# Patient Record
Sex: Male | Born: 1960 | Hispanic: Yes | Smoking: Never smoker
Health system: Southern US, Community
[De-identification: ages and names within clinical notes are randomized; demographics above are authoritative.]

## PROBLEM LIST (undated history)

## (undated) DIAGNOSIS — M109 Gout, unspecified: Secondary | ICD-10-CM

## (undated) DIAGNOSIS — S0291XA Unspecified fracture of skull, initial encounter for closed fracture: Secondary | ICD-10-CM

## (undated) HISTORY — PX: APPENDECTOMY: SHX54

---

## 1998-06-27 ENCOUNTER — Emergency Department (HOSPITAL_COMMUNITY): Admission: EM | Admit: 1998-06-27 | Discharge: 1998-06-27 | Payer: Self-pay | Admitting: Emergency Medicine

## 1999-09-13 ENCOUNTER — Emergency Department (HOSPITAL_COMMUNITY): Admission: EM | Admit: 1999-09-13 | Discharge: 1999-09-13 | Payer: Self-pay | Admitting: Emergency Medicine

## 1999-09-13 ENCOUNTER — Encounter: Payer: Self-pay | Admitting: Emergency Medicine

## 1999-10-09 ENCOUNTER — Encounter: Payer: Self-pay | Admitting: Neurology

## 1999-10-09 ENCOUNTER — Encounter: Admission: RE | Admit: 1999-10-09 | Discharge: 1999-10-09 | Payer: Self-pay | Admitting: Neurology

## 1999-11-02 ENCOUNTER — Emergency Department (HOSPITAL_COMMUNITY): Admission: EM | Admit: 1999-11-02 | Discharge: 1999-11-02 | Payer: Self-pay | Admitting: Emergency Medicine

## 2000-01-02 ENCOUNTER — Emergency Department (HOSPITAL_COMMUNITY): Admission: EM | Admit: 2000-01-02 | Discharge: 2000-01-02 | Payer: Self-pay

## 2000-09-26 ENCOUNTER — Encounter: Payer: Self-pay | Admitting: Internal Medicine

## 2000-09-26 ENCOUNTER — Emergency Department (HOSPITAL_COMMUNITY): Admission: EM | Admit: 2000-09-26 | Discharge: 2000-09-26 | Payer: Self-pay | Admitting: Internal Medicine

## 2004-11-15 ENCOUNTER — Ambulatory Visit: Payer: Self-pay | Admitting: Internal Medicine

## 2014-10-06 ENCOUNTER — Encounter: Payer: Self-pay | Admitting: Medical

## 2014-10-06 NOTE — Progress Notes (Signed)
This encounter was created in error - please disregard.

## 2015-01-27 ENCOUNTER — Ambulatory Visit: Payer: Self-pay | Admitting: Internal Medicine

## 2015-01-27 ENCOUNTER — Ambulatory Visit: Payer: Self-pay | Admitting: Medical

## 2017-12-24 ENCOUNTER — Emergency Department (HOSPITAL_BASED_OUTPATIENT_CLINIC_OR_DEPARTMENT_OTHER)
Admission: EM | Admit: 2017-12-24 | Discharge: 2017-12-24 | Disposition: A | Discharge status: Home / Self Care | Payer: No Typology Code available for payment source | Attending: Emergency Medicine | Admitting: Emergency Medicine

## 2017-12-24 ENCOUNTER — Emergency Department (HOSPITAL_BASED_OUTPATIENT_CLINIC_OR_DEPARTMENT_OTHER): Payer: No Typology Code available for payment source

## 2017-12-24 ENCOUNTER — Encounter (HOSPITAL_BASED_OUTPATIENT_CLINIC_OR_DEPARTMENT_OTHER): Payer: Self-pay

## 2017-12-24 ENCOUNTER — Other Ambulatory Visit: Payer: Self-pay

## 2017-12-24 DIAGNOSIS — R51 Headache: Secondary | ICD-10-CM | POA: Diagnosis not present

## 2017-12-24 HISTORY — DX: Gout, unspecified: M10.9

## 2017-12-24 HISTORY — DX: Unspecified fracture of skull, initial encounter for closed fracture: S02.91XA

## 2017-12-24 MED ORDER — METHOCARBAMOL 500 MG PO TABS: 500.0000 mg | ORAL_TABLET | Freq: Three times a day (TID) | ORAL | 0 refills | Status: DC | PRN
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
## 2018-01-07 ENCOUNTER — Other Ambulatory Visit: Payer: Self-pay

## 2018-01-07 ENCOUNTER — Encounter (HOSPITAL_BASED_OUTPATIENT_CLINIC_OR_DEPARTMENT_OTHER): Payer: Self-pay

## 2018-01-07 ENCOUNTER — Emergency Department (HOSPITAL_COMMUNITY): Payer: No Typology Code available for payment source

## 2018-01-07 ENCOUNTER — Emergency Department (HOSPITAL_BASED_OUTPATIENT_CLINIC_OR_DEPARTMENT_OTHER)
Admission: EM | Admit: 2018-01-07 | Discharge: 2018-01-07 | Disposition: A | Discharge status: Home / Self Care | Payer: No Typology Code available for payment source | Attending: Emergency Medicine | Admitting: Emergency Medicine

## 2018-01-07 DIAGNOSIS — Z79899 Other long term (current) drug therapy: Secondary | ICD-10-CM | POA: Diagnosis not present

## 2018-01-07 DIAGNOSIS — R51 Headache: Secondary | ICD-10-CM | POA: Diagnosis present

## 2018-01-07 DIAGNOSIS — F0781 Postconcussional syndrome: Secondary | ICD-10-CM | POA: Insufficient documentation

## 2018-01-07 DIAGNOSIS — G44309 Post-traumatic headache, unspecified, not intractable: Secondary | ICD-10-CM | POA: Insufficient documentation

## 2018-01-07 MED ORDER — DIPHENHYDRAMINE HCL 50 MG/ML IJ SOLN
25.0000 mg | Freq: Once | INTRAMUSCULAR | Status: AC
  Administered 2018-01-07: 25 mg via INTRAVENOUS
  Filled 2018-01-07: qty 1

## 2018-01-07 MED ORDER — KETOROLAC TROMETHAMINE 30 MG/ML IJ SOLN
30.0000 mg | Freq: Once | INTRAMUSCULAR | Status: AC
  Administered 2018-01-07: 30 mg via INTRAVENOUS
  Filled 2018-01-07: qty 1

## 2018-01-07 MED ORDER — METOCLOPRAMIDE HCL 5 MG/ML IJ SOLN
10.0000 mg | Freq: Once | INTRAMUSCULAR | Status: AC
  Administered 2018-01-07: 10 mg via INTRAVENOUS
  Filled 2018-01-07: qty 2

## 2018-01-07 MED ORDER — AMITRIPTYLINE HCL 50 MG PO TABS: 50.0000 mg | ORAL_TABLET | Freq: Every day | ORAL | 0 refills | Status: DC

## 2018-01-07 MED ORDER — SODIUM CHLORIDE 0.9 % IV BOLUS
500.0000 mL | Freq: Once | INTRAVENOUS | Status: AC
  Administered 2018-01-07: 500 mL via INTRAVENOUS
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
## 2018-01-07 NOTE — ED Provider Notes (Signed)
  Physical Exam  BP 129/75   Pulse (!) 58   Temp 98 F (36.7 C) (Oral)   Resp 16   Ht 5\' 7"  (1.702 m)   Wt 111.6 kg (246 lb)   SpO2 96%   BMI 38.53 kg/m   Physical Exam  ED Course/Procedures     Procedures  MDM  Patient transferred from med center for MRI MRV.  States headache is doing some better but is sleepy due to the medicines.  MRI is negative.  Likely concussion/postconcussive syndrome.  Discharge home with amitriptyline as per neurology recommendation.       Benjiman CorePickering, Keldan Eplin, MD 01/09/18 [phone]

## 2018-01-07 NOTE — ED Notes (Addendum)
Pt called x2 in lobby to be roomed. No response x2.
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
## 2020-11-10 ENCOUNTER — Ambulatory Visit (INDEPENDENT_AMBULATORY_CARE_PROVIDER_SITE_OTHER): Payer: Self-pay

## 2020-11-10 ENCOUNTER — Other Ambulatory Visit: Payer: Self-pay

## 2020-11-10 ENCOUNTER — Ambulatory Visit (INDEPENDENT_AMBULATORY_CARE_PROVIDER_SITE_OTHER): Payer: Self-pay | Admitting: Sports Medicine

## 2020-11-10 DIAGNOSIS — M25511 Pain in right shoulder: Secondary | ICD-10-CM | POA: Insufficient documentation

## 2020-11-10 DIAGNOSIS — M170 Bilateral primary osteoarthritis of knee: Secondary | ICD-10-CM

## 2020-11-10 DIAGNOSIS — G8929 Other chronic pain: Secondary | ICD-10-CM

## 2020-11-10 DIAGNOSIS — M109 Gout, unspecified: Secondary | ICD-10-CM | POA: Insufficient documentation

## 2020-11-10 DIAGNOSIS — E669 Obesity, unspecified: Secondary | ICD-10-CM | POA: Insufficient documentation

## 2020-11-10 DIAGNOSIS — M2550 Pain in unspecified joint: Secondary | ICD-10-CM

## 2020-11-10 DIAGNOSIS — M1A0710 Idiopathic chronic gout, right ankle and foot, without tophus (tophi): Secondary | ICD-10-CM

## 2020-11-10 MED ORDER — PREDNISONE 10 MG (48) PO TBPK: ORAL_TABLET | Freq: Every day | ORAL | 0 refills | Status: DC

## 2020-11-10 NOTE — Assessment & Plan Note (Addendum)
Chronic idiopathic gout, currently on 300 mg of allopurinol daily, it sounds like his uric acid levels were still high, he is due for recheck so we will go ahead and check his uric acid levels and increase to 300 twice daily if above 5. We could also add probenecid if we maxed out his allopurinol.  Update 11/13/2020: Uric acid levels are above 9, increasing allopurinol to 300 mg twice daily.  Recheck uric acid levels in a month.

## 2020-11-10 NOTE — Assessment & Plan Note (Signed)
Widespread aches and pains with morning stiffness, we do need to do a full rheumatoid panel. Also starting a prednisone taper.

## 2020-11-10 NOTE — Assessment & Plan Note (Signed)
Bilateral x-rays, prednisone as above, we will address the individual joints at a future visit.

## 2020-11-10 NOTE — Assessment & Plan Note (Signed)
Needs to work aggressively on weight loss both with nutrition and his PCP.

## 2020-11-10 NOTE — Progress Notes (Addendum)
    Procedures performed today:    None.  Independent interpretation of notes and tests performed by another provider:   None.  Brief History, Exam, Impression, and Recommendations:    Polyarthralgia Widespread aches and pains with morning stiffness, we do need to do a full rheumatoid panel. Also starting a prednisone taper.  Gout Chronic idiopathic gout, currently on 300 mg of allopurinol daily, it sounds like his uric acid levels were still high, he is due for recheck so we will go ahead and check his uric acid levels and increase to 300 twice daily if above 5. We could also add probenecid if we maxed out his allopurinol.  Update 11/13/2020: Uric acid levels are above 9, increasing allopurinol to 300 mg twice daily.  Recheck uric acid levels in a month.  Right shoulder pain Impingement symptoms and signs on the right with a positive Hawkins sign, we will do a full rheumatoid work-up and some prednisone before considering treating his individual pains. I would like some x-rays of his shoulder  Primary osteoarthritis of both knees Bilateral x-rays, prednisone as above, we will address the individual joints at a future visit.  Morbid obesity (HCC) Needs to work aggressively on weight loss both with nutrition and his PCP.    ___________________________________________ Ihor Austin. Benjamin Stain, M.D., ABFM., CAQSM. Primary Care and Sports Medicine [facility]  Adjunct Instructor of Family Medicine  [employer]

## 2020-11-10 NOTE — Assessment & Plan Note (Signed)
Impingement symptoms and signs on the right with a positive Hawkins sign, we will do a full rheumatoid work-up and some prednisone before considering treating his individual pains. I would like some x-rays of his shoulder

## 2020-11-11 ENCOUNTER — Telehealth: Payer: Self-pay | Admitting: Physician Assistant

## 2020-11-11 DIAGNOSIS — M2550 Pain in unspecified joint: Secondary | ICD-10-CM

## 2020-11-11 LAB — CBC WITH DIFFERENTIAL/PLATELET: Absolute Monocytes: 1003 cells/uL — ABNORMAL HIGH (ref 200–950)

## 2020-11-11 LAB — COMPREHENSIVE METABOLIC PANEL: AST: 48 U/L — ABNORMAL HIGH (ref 10–35)

## 2020-11-11 MED ORDER — CELECOXIB 100 MG PO CAPS: 100.0000 mg | ORAL_CAPSULE | Freq: Two times a day (BID) | ORAL | 0 refills | Status: DC

## 2020-11-13 ENCOUNTER — Other Ambulatory Visit: Payer: Self-pay | Admitting: Sports Medicine

## 2020-11-13 LAB — CBC WITH DIFFERENTIAL/PLATELET: RDW: 13.4 % (ref 11.0–15.0)

## 2020-11-13 MED ORDER — DEXAMETHASONE 4 MG PO TABS: 4.0000 mg | ORAL_TABLET | Freq: Three times a day (TID) | ORAL | 0 refills | Status: DC

## 2020-11-13 MED ORDER — ALLOPURINOL 300 MG PO TABS: 300.0000 mg | ORAL_TABLET | Freq: Two times a day (BID) | ORAL | 3 refills | Status: AC

## 2020-11-13 NOTE — Addendum Note (Signed)
Addended by: Monica Becton on: 11/13/2020 04:58 PM   Modules accepted: Orders
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
## 2020-11-13 NOTE — Telephone Encounter (Signed)
Pt advised about new medication.

## 2020-11-14 LAB — COMPREHENSIVE METABOLIC PANEL: Albumin: 4.3 g/dL (ref 3.6–5.1)

## 2020-11-17 LAB — LUPUS(12) PANEL
Anti Nuclear Antibody (ANA): NEGATIVE
C3 Complement: 150 mg/dL (ref 82–185)
C4 Complement: 15 mg/dL (ref 15–53)
ENA SM Ab Ser-aCnc: <1 AI
Rheumatoid fact SerPl-aCnc: <14 [IU]/mL (ref <14)
Ribosomal P Protein Ab: <1 AI
SM/RNP: <1 AI
SSA (Ro) (ENA) Antibody, IgG: <1 AI
SSB (La) (ENA) Antibody, IgG: <1 AI
Scleroderma (Scl-70) (ENA) Antibody, IgG: <1 AI
Thyroperoxidase Ab SerPl-aCnc: <1 IU/mL (ref <9)
ds DNA Ab: <1 [IU]/mL

## 2020-11-17 LAB — RHEUMATOID FACTOR (IGA, IGG, IGM)
Rheumatoid Factor (IgA): <5 U (ref <=6)
Rheumatoid Factor (IgG): <5 U (ref <=6)
Rheumatoid Factor (IgM): <5 U (ref <=6)

## 2020-11-17 LAB — CBC WITH DIFFERENTIAL/PLATELET
Basophils Absolute: 70 {cells}/uL (ref 0–200)
Basophils Relative: 0.8 %
Eosinophils Absolute: 308 cells/uL (ref 15–500)
Eosinophils Relative: 3.5 %
HCT: 48.5 % (ref 38.5–50.0)
Hemoglobin: 16.2 g/dL (ref 13.2–17.1)
Lymphs Abs: 3027 cells/uL (ref 850–3900)
MCH: 31 pg (ref 27.0–33.0)
MCHC: 33.4 g/dL (ref 32.0–36.0)
MCV: 92.9 fL (ref 80.0–100.0)
MPV: 11.5 fL (ref 7.5–12.5)
Monocytes Relative: 11.4 %
Neutro Abs: 4391 {cells}/uL (ref 1500–7800)
Neutrophils Relative %: 49.9 %
Platelets: 224 Thousand/uL (ref 140–400)
RBC: 5.22 10*6/uL (ref 4.20–5.80)
Total Lymphocyte: 34.4 %
WBC: 8.8 Thousand/uL (ref 3.8–10.8)

## 2020-11-17 LAB — COMPREHENSIVE METABOLIC PANEL WITH GFR
AG Ratio: 1.5 (calc) (ref 1.0–2.5)
Alkaline phosphatase (APISO): 56 U/L (ref 35–144)
BUN/Creatinine Ratio: 29 (calc) — ABNORMAL HIGH (ref 6–22)
BUN: 28 mg/dL — ABNORMAL HIGH (ref 7–25)
Calcium: 9.6 mg/dL (ref 8.6–10.3)
Chloride: 105 mmol/L (ref 98–110)
Globulin: 2.8 g/dL (ref 1.9–3.7)
Potassium: 4.5 mmol/L (ref 3.5–5.3)
Sodium: 139 mmol/L (ref 135–146)

## 2020-11-17 LAB — HLA-B27 ANTIGEN: HLA-B27 Antigen: POSITIVE — AB

## 2020-11-17 LAB — COMPREHENSIVE METABOLIC PANEL
ALT: 88 U/L — ABNORMAL HIGH (ref 9–46)
CO2: 25 mmol/L (ref 20–32)
Creat: 0.98 mg/dL (ref 0.70–1.33)
Glucose, Bld: 104 mg/dL — ABNORMAL HIGH (ref 65–99)
Total Bilirubin: 0.3 mg/dL (ref 0.2–1.2)
Total Protein: 7.1 g/dL (ref 6.1–8.1)

## 2020-11-17 LAB — CK: Total CK: 77 U/L (ref 44–196)

## 2020-11-17 LAB — URIC ACID: Uric Acid, Serum: 9.1 mg/dL — ABNORMAL HIGH (ref 4.0–8.0)

## 2020-11-17 LAB — CYCLIC CITRUL PEPTIDE ANTIBODY, IGG: Cyclic Citrullin Peptide Ab: <16 U

## 2020-11-17 LAB — SEDIMENTATION RATE: Sed Rate: 2 mm/h (ref 0–20)

## 2020-11-24 ENCOUNTER — Other Ambulatory Visit: Payer: Self-pay | Admitting: Physician Assistant

## 2020-11-24 DIAGNOSIS — M2550 Pain in unspecified joint: Secondary | ICD-10-CM

## 2021-02-05 ENCOUNTER — Other Ambulatory Visit: Payer: Self-pay

## 2021-02-05 ENCOUNTER — Emergency Department (HOSPITAL_BASED_OUTPATIENT_CLINIC_OR_DEPARTMENT_OTHER)
Admission: EM | Admit: 2021-02-05 | Discharge: 2021-02-06 | Disposition: A | Discharge status: Home / Self Care | Payer: Self-pay | Attending: Emergency Medicine | Admitting: Emergency Medicine

## 2021-02-05 ENCOUNTER — Encounter (HOSPITAL_BASED_OUTPATIENT_CLINIC_OR_DEPARTMENT_OTHER): Payer: Self-pay

## 2021-02-05 DIAGNOSIS — E860 Dehydration: Secondary | ICD-10-CM

## 2021-02-05 DIAGNOSIS — R112 Nausea with vomiting, unspecified: Secondary | ICD-10-CM

## 2021-02-05 DIAGNOSIS — R197 Diarrhea, unspecified: Secondary | ICD-10-CM | POA: Insufficient documentation

## 2021-02-05 DIAGNOSIS — K529 Noninfective gastroenteritis and colitis, unspecified: Secondary | ICD-10-CM

## 2021-02-05 DIAGNOSIS — Z8739 Personal history of other diseases of the musculoskeletal system and connective tissue: Secondary | ICD-10-CM

## 2021-02-05 LAB — URINALYSIS, ROUTINE W REFLEX MICROSCOPIC
Bilirubin Urine: NEGATIVE
Glucose, UA: NEGATIVE mg/dL
Hgb urine dipstick: NEGATIVE
Ketones, ur: NEGATIVE mg/dL
Leukocytes,Ua: NEGATIVE
Nitrite: NEGATIVE
Protein, ur: NEGATIVE mg/dL
Specific Gravity, Urine: <1.005 — ABNORMAL LOW (ref 1.005–1.030)
pH: 6 (ref 5.0–8.0)

## 2021-02-05 LAB — COMPREHENSIVE METABOLIC PANEL
ALT: 44 U/L (ref 0–44)
AST: 26 U/L (ref 15–41)
Albumin: 3.9 g/dL (ref 3.5–5.0)
Alkaline Phosphatase: 51 U/L (ref 38–126)
Anion gap: 7 (ref 5–15)
BUN: 22 mg/dL — ABNORMAL HIGH (ref 6–20)
CO2: 20 mmol/L — ABNORMAL LOW (ref 22–32)
Calcium: 8.7 mg/dL — ABNORMAL LOW (ref 8.9–10.3)
Chloride: 108 mmol/L (ref 98–111)
Creatinine, Ser: 1.16 mg/dL (ref 0.61–1.24)
GFR, Estimated: >60 mL/min (ref >60)
Glucose, Bld: 115 mg/dL — ABNORMAL HIGH (ref 70–99)
Potassium: 3.7 mmol/L (ref 3.5–5.1)
Sodium: 135 mmol/L (ref 135–145)
Total Bilirubin: 0.6 mg/dL (ref 0.3–1.2)
Total Protein: 7.3 g/dL (ref 6.5–8.1)

## 2021-02-05 LAB — CBC
HCT: 47.2 % (ref 39.0–52.0)
Hemoglobin: 16 g/dL (ref 13.0–17.0)
MCH: 31.6 pg (ref 26.0–34.0)
MCHC: 33.9 g/dL (ref 30.0–36.0)
MCV: 93.1 fL (ref 80.0–100.0)
Platelets: 228 10*3/uL (ref 150–400)
RBC: 5.07 MIL/uL (ref 4.22–5.81)
RDW: 13.6 % (ref 11.5–15.5)
WBC: 11.1 10*3/uL — ABNORMAL HIGH (ref 4.0–10.5)
nRBC: 0 % (ref 0.0–0.2)

## 2021-02-05 LAB — LIPASE, BLOOD: Lipase: 29 U/L (ref 11–51)

## 2021-02-05 MED ORDER — ONDANSETRON 8 MG PO TBDP: 8.0000 mg | ORAL_TABLET | Freq: Three times a day (TID) | ORAL | 0 refills | Status: AC | PRN

## 2021-02-05 MED ORDER — ONDANSETRON HCL 4 MG/2ML IJ SOLN
4.0000 mg | Freq: Once | INTRAMUSCULAR | Status: AC
  Administered 2021-02-05: 4 mg via INTRAVENOUS
  Filled 2021-02-05: qty 2

## 2021-02-05 MED ORDER — SODIUM CHLORIDE 0.9 % IV BOLUS
1000.0000 mL | Freq: Once | INTRAVENOUS | Status: AC
  Administered 2021-02-05: 1000 mL via INTRAVENOUS

## 2021-02-05 MED ORDER — DEXAMETHASONE SODIUM PHOSPHATE 10 MG/ML IJ SOLN
10.0000 mg | Freq: Once | INTRAMUSCULAR | Status: AC
  Administered 2021-02-05: 10 mg via INTRAMUSCULAR
  Filled 2021-02-05: qty 1

## 2021-02-05 MED ORDER — TRAMADOL HCL 50 MG PO TABS
50.0000 mg | ORAL_TABLET | Freq: Once | ORAL | Status: AC
Start: 2021-02-05 — End: 2021-02-05
  Administered 2021-02-05: 50 mg via ORAL
  Filled 2021-02-05: qty 1

## 2021-02-05 NOTE — ED Notes (Signed)
Pt informed of waiting on stool results, importance of hand hygeine and needing to use bedside cammode while here in ED

## 2021-02-05 NOTE — ED Notes (Signed)
Pt used bedside commode.  Stool sample sent to lab
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
## 2021-02-05 NOTE — Discharge Instructions (Addendum)
It was our pleasure to provide your ER care today - we hope that you feel better.  Drink plenty of fluids/stay well hydrated. Take zofran as need for nausea.   Take your gout medication as need.   We sent some stool studies which remain pending - you may check MyChart and/or call for results in AM.   Follow up with primary care doctor in the next 2-3 days if symptoms fail to improve/resolve.  Return to ER if worse, new symptoms, fevers, new, worsening or severe pain, severe abdominal pain, persistent vomiting, or other concern.   You were given pain medication in the ER - no driving for the next 6 hours.

## 2021-02-05 NOTE — ED Notes (Signed)
Pt assisted onto cammode

## 2021-02-05 NOTE — ED Notes (Signed)
Pt updated on plan of care

## 2021-02-05 NOTE — ED Provider Notes (Signed)
[facility] EMERGENCY DEPARTMENT Provider Note   CSN: [id number] Arrival date & time: 02/05/21  1718     History Chief Complaint  Patient presents with   Emesis    Jesse Davidson is a 60 y.o. male.  Patient with c/o nausea/vomiting and diarrhea in the past 5 days. Symptoms acute onset day after eating at restaurant. States others that ate with him were similarly ill, but they improved after 2-3 days. Symptoms acute onset, episodic, persistent. Emesis is clear or color recently ingested food and liquids - not bloody or bilious. Diarrhea watery, not bloody. No recent change in meds, new meds, or antibiotic use. No travel. No fever or chills. No constant and/or focal abd pain. No dysuria or gu c/o. No chest pain or sob.   The history is provided by the patient.  Emesis Associated symptoms: diarrhea   Associated symptoms: no cough, no fever, no headaches and no sore throat       Past Medical History:  Diagnosis Date   Gout    Skull fracture [facility])     Patient Active Problem List   Diagnosis Date Noted   Polyarthralgia 11/10/2020   Gout 11/10/2020   Right shoulder pain 11/10/2020   Primary osteoarthritis of both knees 11/10/2020   Morbid obesity (HCC) 11/10/2020    Past Surgical History:  Procedure Laterality Date   APPENDECTOMY         No family history on file.  Social History   Tobacco Use   Smoking status: Never   Smokeless tobacco: Never  Substance Use Topics   Alcohol use: Not Currently   Drug use: Never    Home Medications Prior to Admission medications   Medication Sig Start Date End Date Taking? Authorizing Provider  allopurinol (ZYLOPRIM) 300 MG tablet Take 1 tablet (300 mg total) by mouth 2 (two) times daily. 11/13/20   Monica Becton, MD  celecoxib (CELEBREX) 100 MG capsule Take 1 capsule (100 mg total) by mouth 2 (two) times daily. 11/11/20   Abonza, Kandis Cocking, PA-C  colchicine 0.6 MG tablet colchicine 0.6 mg tablet  TAKE ONE  TABLET BY MOUTH TWICE A DAY    [provider]  dexamethasone (DECADRON) 4 MG tablet Take 1 tablet (4 mg total) by mouth 3 (three) times daily. 11/13/20   Monica Becton, MD    Allergies    Codeine, Hydrocodone, and Prednisone  Review of Systems   Review of Systems  Constitutional:  Negative for fever.  HENT:  Negative for sore throat.   Eyes:  Negative for redness.  Respiratory:  Negative for cough and shortness of breath.   Cardiovascular:  Negative for chest pain.  Gastrointestinal:  Positive for diarrhea and vomiting.  Genitourinary:  Negative for dysuria and flank pain.  Musculoskeletal:  Negative for back pain and neck pain.  Skin:  Negative for rash.  Neurological:  Negative for headaches.  Hematological:  Does not bruise/bleed easily.  Psychiatric/Behavioral:  Negative for confusion.    Physical Exam Updated Vital Signs BP (!) 149/89 (BP Location: Right Arm)   Pulse (!) 104   Temp 99.3 F (37.4 C) (Oral)   Resp 18   Ht 1.702 m (5\' 7" )   Wt 117.9 kg   SpO2 95%   BMI 40.72 kg/m   Physical Exam Vitals and nursing note reviewed.  Constitutional:      Appearance: Normal appearance. He is well-developed.  HENT:     Head: Atraumatic.     Nose: Nose  normal.     Mouth/Throat:     Mouth: Mucous membranes are moist.     Pharynx: Oropharynx is clear.  Eyes:     General: No scleral icterus.    Conjunctiva/sclera: Conjunctivae normal.  Neck:     Trachea: No tracheal deviation.  Cardiovascular:     Rate and Rhythm: Normal rate and regular rhythm.     Pulses: Normal pulses.     Heart sounds: Normal heart sounds. No murmur heard.   No friction rub. No gallop.  Pulmonary:     Effort: Pulmonary effort is normal. No accessory muscle usage or respiratory distress.     Breath sounds: Normal breath sounds.  Abdominal:     General: Bowel sounds are normal. There is no distension.     Palpations: Abdomen is soft. There is no mass.     Tenderness: There is  no abdominal tenderness. There is no guarding or rebound.     Hernia: No hernia is present.  Genitourinary:    Comments: No cva tenderness. Musculoskeletal:        General: No swelling.     Cervical back: Normal range of motion and neck supple. No rigidity.  Skin:    General: Skin is warm and dry.     Findings: No rash.  Neurological:     Mental Status: He is alert.     Comments: Alert, speech clear.   Psychiatric:        Mood and Affect: Mood normal.    ED Results / Procedures / Treatments   Labs (all labs ordered are listed, but only abnormal results are displayed) Results for orders placed or performed during the hospital encounter of 02/05/21  Lipase, blood  Result Value Ref Range   Lipase 29 11 - 51 U/L  Comprehensive metabolic panel  Result Value Ref Range   Sodium 135 135 - 145 mmol/L   Potassium 3.7 3.5 - 5.1 mmol/L   Chloride 108 98 - 111 mmol/L   CO2 20 (L) 22 - 32 mmol/L   Glucose, Bld 115 (H) 70 - 99 mg/dL   BUN 22 (H) 6 - 20 mg/dL   Creatinine, Ser 6.31 0.61 - 1.24 mg/dL   Calcium 8.7 (L) 8.9 - 10.3 mg/dL   Total Protein 7.3 6.5 - 8.1 g/dL   Albumin 3.9 3.5 - 5.0 g/dL   AST 26 15 - 41 U/L   ALT 44 0 - 44 U/L   Alkaline Phosphatase 51 38 - 126 U/L   Total Bilirubin 0.6 0.3 - 1.2 mg/dL   GFR, Estimated >49 >70 mL/min   Anion gap 7 5 - 15  CBC  Result Value Ref Range   WBC 11.1 (H) 4.0 - 10.5 K/uL   RBC 5.07 4.22 - 5.81 MIL/uL   Hemoglobin 16.0 13.0 - 17.0 g/dL   HCT 26.3 78.5 - 88.5 %   MCV 93.1 80.0 - 100.0 fL   MCH 31.6 26.0 - 34.0 pg   MCHC 33.9 30.0 - 36.0 g/dL   RDW 02.7 74.1 - 28.7 %   Platelets 228 150 - 400 K/uL   nRBC 0.0 0.0 - 0.2 %      EKG None  Radiology No results found.  Procedures Procedures   Medications Ordered in ED Medications  sodium chloride 0.9 % bolus 1,000 mL (has no administration in time range)    ED Course  I have reviewed the triage vital signs and the nursing notes.  Pertinent labs & imaging results  that were available during my care of the patient were reviewed by me and considered in my medical decision making (see chart for details).    MDM Rules/Calculators/A&P                         Iv ns bolus. Zofran iv. Labs sent.   Reviewed nursing notes and prior charts for additional history.   Labs reviewed/interpreted by me - chem normal.   Additional liter ns iv.   Trial of po fluids.   Recheck abd soft nt.   No recurrent emesis.   Pt requests 'shot' for gout. Decadron im.   Pt also given ultram po.   Pt currently appears stable for d/c.   Return precautions provided.      Final Clinical Impression(s) / ED Diagnoses Final diagnoses:  None    Rx / DC Orders ED Discharge Orders     None        Cathren Laine, MD 02/05/21 2213

## 2021-02-06 LAB — C DIFFICILE QUICK SCREEN W PCR REFLEX
C Diff antigen: NEGATIVE
C Diff interpretation: NOT DETECTED
C Diff toxin: NEGATIVE

## 2021-02-06 LAB — GASTROINTESTINAL PANEL BY PCR, STOOL (REPLACES STOOL CULTURE)

## 2021-07-28 ENCOUNTER — Emergency Department (HOSPITAL_BASED_OUTPATIENT_CLINIC_OR_DEPARTMENT_OTHER): Payer: Self-pay

## 2021-07-28 ENCOUNTER — Encounter (HOSPITAL_BASED_OUTPATIENT_CLINIC_OR_DEPARTMENT_OTHER): Payer: Self-pay | Admitting: *Deleted

## 2021-07-28 ENCOUNTER — Other Ambulatory Visit: Payer: Self-pay

## 2021-07-28 ENCOUNTER — Inpatient Hospital Stay (HOSPITAL_BASED_OUTPATIENT_CLINIC_OR_DEPARTMENT_OTHER)
Admission: EM | Admit: 2021-07-28 | Discharge: 2021-07-31 | DRG: 177 | Disposition: A | Discharge status: Home / Self Care | Payer: Self-pay | Attending: Family Medicine | Admitting: Family Medicine

## 2021-07-28 ENCOUNTER — Emergency Department (HOSPITAL_BASED_OUTPATIENT_CLINIC_OR_DEPARTMENT_OTHER): Payer: Self-pay | Admitting: Radiology

## 2021-07-28 DIAGNOSIS — D72829 Elevated white blood cell count, unspecified: Secondary | ICD-10-CM | POA: Diagnosis present

## 2021-07-28 DIAGNOSIS — Z888 Allergy status to other drugs, medicaments and biological substances status: Secondary | ICD-10-CM

## 2021-07-28 DIAGNOSIS — U071 COVID-19: Principal | ICD-10-CM | POA: Diagnosis present

## 2021-07-28 DIAGNOSIS — Y92239 Unspecified place in hospital as the place of occurrence of the external cause: Secondary | ICD-10-CM | POA: Diagnosis present

## 2021-07-28 DIAGNOSIS — T380X5A Adverse effect of glucocorticoids and synthetic analogues, initial encounter: Secondary | ICD-10-CM | POA: Diagnosis present

## 2021-07-28 DIAGNOSIS — R0902 Hypoxemia: Secondary | ICD-10-CM

## 2021-07-28 DIAGNOSIS — Z885 Allergy status to narcotic agent status: Secondary | ICD-10-CM

## 2021-07-28 DIAGNOSIS — N179 Acute kidney failure, unspecified: Secondary | ICD-10-CM | POA: Diagnosis present

## 2021-07-28 DIAGNOSIS — J9601 Acute respiratory failure with hypoxia: Secondary | ICD-10-CM | POA: Diagnosis present

## 2021-07-28 DIAGNOSIS — E669 Obesity, unspecified: Secondary | ICD-10-CM | POA: Diagnosis present

## 2021-07-28 DIAGNOSIS — Z79899 Other long term (current) drug therapy: Secondary | ICD-10-CM

## 2021-07-28 DIAGNOSIS — Z6838 Body mass index (BMI) 38.0-38.9, adult: Secondary | ICD-10-CM

## 2021-07-28 DIAGNOSIS — Z20822 Contact with and (suspected) exposure to covid-19: Secondary | ICD-10-CM | POA: Diagnosis present

## 2021-07-28 LAB — COMPREHENSIVE METABOLIC PANEL
ALT: 53 U/L — ABNORMAL HIGH (ref 0–44)
AST: 33 U/L (ref 15–41)
Albumin: 3.9 g/dL (ref 3.5–5.0)
Alkaline Phosphatase: 44 U/L (ref 38–126)
Anion gap: 13 (ref 5–15)
BUN: 20 mg/dL (ref 6–20)
CO2: 25 mmol/L (ref 22–32)
Calcium: 8.7 mg/dL — ABNORMAL LOW (ref 8.9–10.3)
Chloride: 98 mmol/L (ref 98–111)
Creatinine, Ser: 1.5 mg/dL — ABNORMAL HIGH (ref 0.61–1.24)
GFR, Estimated: 53 mL/min — ABNORMAL LOW (ref >60)
Glucose, Bld: 106 mg/dL — ABNORMAL HIGH (ref 70–99)
Potassium: 4.3 mmol/L (ref 3.5–5.1)
Sodium: 136 mmol/L (ref 135–145)
Total Bilirubin: 0.7 mg/dL (ref 0.3–1.2)
Total Protein: 7.6 g/dL (ref 6.5–8.1)

## 2021-07-28 LAB — CBC WITH DIFFERENTIAL/PLATELET
Abs Immature Granulocytes: 0.19 10*3/uL — ABNORMAL HIGH (ref 0.00–0.07)
Basophils Absolute: 0.1 10*3/uL (ref 0.0–0.1)
Basophils Relative: 0 %
Eosinophils Absolute: 0 10*3/uL (ref 0.0–0.5)
Eosinophils Relative: 0 %
HCT: 54.3 % — ABNORMAL HIGH (ref 39.0–52.0)
Hemoglobin: 17.9 g/dL — ABNORMAL HIGH (ref 13.0–17.0)
Immature Granulocytes: 1 %
Lymphocytes Relative: 12 %
Lymphs Abs: 2.4 10*3/uL (ref 0.7–4.0)
MCH: 30.7 pg (ref 26.0–34.0)
MCHC: 33 g/dL (ref 30.0–36.0)
MCV: 93.1 fL (ref 80.0–100.0)
Monocytes Absolute: 1.5 10*3/uL — ABNORMAL HIGH (ref 0.1–1.0)
Monocytes Relative: 7 %
Neutro Abs: 15.9 10*3/uL — ABNORMAL HIGH (ref 1.7–7.7)
Neutrophils Relative %: 80 %
Platelets: 257 10*3/uL (ref 150–400)
RBC: 5.83 MIL/uL — ABNORMAL HIGH (ref 4.22–5.81)
RDW: 12.6 % (ref 11.5–15.5)
WBC: 20 10*3/uL — ABNORMAL HIGH (ref 4.0–10.5)
nRBC: 0 % (ref 0.0–0.2)

## 2021-07-28 LAB — I-STAT VENOUS BLOOD GAS, ED
Acid-Base Excess: 7 mmol/L — ABNORMAL HIGH (ref 0.0–2.0)
Bicarbonate: 33.3 mmol/L — ABNORMAL HIGH (ref 20.0–28.0)
Calcium, Ion: 1.09 mmol/L — ABNORMAL LOW (ref 1.15–1.40)
HCT: 55 % — ABNORMAL HIGH (ref 39.0–52.0)
Hemoglobin: 18.7 g/dL — ABNORMAL HIGH (ref 13.0–17.0)
O2 Saturation: 72 %
Potassium: 5.5 mmol/L — ABNORMAL HIGH (ref 3.5–5.1)
Sodium: 134 mmol/L — ABNORMAL LOW (ref 135–145)
TCO2: 35 mmol/L — ABNORMAL HIGH (ref 22–32)
pCO2, Ven: 51.8 mmHg (ref 44.0–60.0)
pH, Ven: 7.416 (ref 7.250–7.430)
pO2, Ven: 38 mmHg (ref 32.0–45.0)

## 2021-07-28 LAB — RESP PANEL BY RT-PCR (FLU A&B, COVID) ARPGX2
Influenza A by PCR: NEGATIVE
Influenza B by PCR: NEGATIVE
SARS Coronavirus 2 by RT PCR: POSITIVE — AB

## 2021-07-28 LAB — TROPONIN I (HIGH SENSITIVITY)
Troponin I (High Sensitivity): 23 ng/L — ABNORMAL HIGH (ref <18)
Troponin I (High Sensitivity): 17 ng/L (ref <18)

## 2021-07-28 MED ORDER — LACTATED RINGERS IV SOLN: INTRAVENOUS | Status: AC

## 2021-07-28 MED ORDER — METHYLPREDNISOLONE SODIUM SUCC 125 MG IJ SOLR
125.0000 mg | Freq: Once | INTRAMUSCULAR | Status: AC
  Administered 2021-07-28: 125 mg via INTRAVENOUS
  Filled 2021-07-28: qty 2

## 2021-07-28 MED ORDER — SODIUM CHLORIDE 0.9 % IV SOLN
100.0000 mg | Freq: Every day | INTRAVENOUS | Status: DC
  Administered 2021-07-29 – 2021-07-31 (×3): 100 mg via INTRAVENOUS
  Filled 2021-07-28 (×3): qty 20

## 2021-07-28 MED ORDER — IPRATROPIUM-ALBUTEROL 0.5-2.5 (3) MG/3ML IN SOLN: 3.0000 mL | Freq: Once | RESPIRATORY_TRACT | Status: DC

## 2021-07-28 MED ORDER — SODIUM CHLORIDE 0.9 % IV SOLN
100.0000 mg | Freq: Once | INTRAVENOUS | Status: AC
  Administered 2021-07-28: 100 mg via INTRAVENOUS

## 2021-07-28 MED ORDER — IPRATROPIUM-ALBUTEROL 0.5-2.5 (3) MG/3ML IN SOLN
3.0000 mL | Freq: Once | RESPIRATORY_TRACT | Status: AC
  Administered 2021-07-28: 3 mL via RESPIRATORY_TRACT
  Filled 2021-07-28: qty 3

## 2021-07-28 MED ORDER — MAGNESIUM SULFATE 2 GM/50ML IV SOLN
2.0000 g | Freq: Once | INTRAVENOUS | Status: AC
  Administered 2021-07-28: 2 g via INTRAVENOUS
  Filled 2021-07-28: qty 50

## 2021-07-28 MED ORDER — SODIUM CHLORIDE 0.9 % IV SOLN
100.0000 mg | Freq: Once | INTRAVENOUS | Status: AC
  Administered 2021-07-29: 100 mg via INTRAVENOUS
  Filled 2021-07-28: qty 20

## 2021-07-28 NOTE — ED Triage Notes (Addendum)
C/o cough. Onset 12/31. Has been seen for the same. No relief with albuterol, and prednisone. Using inhaler 2-3x per day, but doesn't seem to be getting medicine. 2 days left on prednisone taper. Seen at [facility]. Denies fever, chills, NVD. Decreased appetite. Denies allergy to prednisone. SPO2 is low.

## 2021-07-28 NOTE — ED Provider Notes (Signed)
[facility] EMERGENCY DEPT Provider Note   CSN: [id number] Arrival date & time: 07/28/21  1743     History  Chief Complaint  Patient presents with   Cough    Jesse Davidson is a 61 y.o. male.  Patient is a 61 year old male who has a history of gout but otherwise is relatively healthy presenting today with persistent cough and shortness of breath.  Patient reports that he started developing cough and URI symptoms on 07/21/2021.  Because symptoms were not improving he went and saw his doctor and was given albuterol and prednisone.  At the time he was tested for flu and COVID which was negative.  He denies any fever, productive cough, chills, nausea vomiting or diarrhea.  He has had decreased appetite and has felt short of breath.  He denies any chest pain.  He does not have a history of asthma and does not use inhalers regularly.  He does not use tobacco products.  He reports he usually gets sick about once a year and it usually is bad when he does.  He denies any history of CHF he has not noticed any swelling in his lower extremities and has no history of heart disease.  The history is provided by the patient and medical records.  Cough     Home Medications Prior to Admission medications   Medication Sig Start Date End Date Taking? Authorizing Provider  allopurinol (ZYLOPRIM) 300 MG tablet Take 1 tablet (300 mg total) by mouth 2 (two) times daily. 11/13/20   Monica Becton, MD  celecoxib (CELEBREX) 100 MG capsule Take 1 capsule (100 mg total) by mouth 2 (two) times daily. 11/11/20   Abonza, Kandis Cocking, PA-C  colchicine 0.6 MG tablet colchicine 0.6 mg tablet  TAKE ONE TABLET BY MOUTH TWICE A DAY    [provider]  dexamethasone (DECADRON) 4 MG tablet Take 1 tablet (4 mg total) by mouth 3 (three) times daily. 11/13/20   Monica Becton, MD  ondansetron (ZOFRAN ODT) 8 MG disintegrating tablet Take 1 tablet (8 mg total) by mouth every 8 (eight) hours as  needed for nausea or vomiting. 02/05/21   Cathren Laine, MD      Allergies    Codeine, Hydrocodone, and Prednisone    Review of Systems   Review of Systems  Respiratory:  Positive for cough.    Physical Exam Updated Vital Signs BP (!) 142/95    Pulse 95    Temp 99.9 F (37.7 C) (Oral)    Resp (!) 22    Ht 5\' 7"  (1.702 m)    Wt 111.1 kg    SpO2 90%    BMI 38.37 kg/m  Physical Exam Vitals and nursing note reviewed.  Constitutional:      General: He is not in acute distress.    Appearance: He is well-developed.  HENT:     Head: Normocephalic and atraumatic.  Eyes:     Conjunctiva/sclera: Conjunctivae normal.     Pupils: Pupils are equal, round, and reactive to light.  Cardiovascular:     Rate and Rhythm: Regular rhythm. Tachycardia present.     Pulses: Normal pulses.     Heart sounds: No murmur heard. Pulmonary:     Effort: Pulmonary effort is normal. Tachypnea present. No respiratory distress.     Breath sounds: Decreased air movement present. Wheezing present. No rales.     Comments: Persistent coughing throughout exam Abdominal:     General: There is no distension.  Palpations: Abdomen is soft.     Tenderness: There is no abdominal tenderness. There is no guarding or rebound.  Musculoskeletal:        General: No tenderness. Normal range of motion.     Cervical back: Normal range of motion and neck supple.     Right lower leg: No edema.     Left lower leg: No edema.  Skin:    General: Skin is warm and dry.     Findings: No erythema or rash.  Neurological:     Mental Status: He is alert and oriented to person, place, and time. Mental status is at baseline.  Psychiatric:        Mood and Affect: Mood normal.        Behavior: Behavior normal.    ED Results / Procedures / Treatments   Labs (all labs ordered are listed, but only abnormal results are displayed) Labs Reviewed  RESP PANEL BY RT-PCR (FLU A&B, COVID) ARPGX2 - Abnormal; Notable for the following  components:      Result Value   SARS Coronavirus 2 by RT PCR POSITIVE (*)    All other components within normal limits  CBC WITH DIFFERENTIAL/PLATELET - Abnormal; Notable for the following components:   WBC 20.0 (*)    RBC 5.83 (*)    Hemoglobin 17.9 (*)    HCT 54.3 (*)    Neutro Abs 15.9 (*)    Monocytes Absolute 1.5 (*)    Abs Immature Granulocytes 0.19 (*)    All other components within normal limits  COMPREHENSIVE METABOLIC PANEL - Abnormal; Notable for the following components:   Glucose, Bld 106 (*)    Creatinine, Ser 1.50 (*)    Calcium 8.7 (*)    ALT 53 (*)    GFR, Estimated 53 (*)    All other components within normal limits  I-STAT VENOUS BLOOD GAS, ED - Abnormal; Notable for the following components:   Bicarbonate 33.3 (*)    TCO2 35 (*)    Acid-Base Excess 7.0 (*)    Sodium 134 (*)    Potassium 5.5 (*)    Calcium, Ion 1.09 (*)    HCT 55.0 (*)    Hemoglobin 18.7 (*)    All other components within normal limits  TROPONIN I (HIGH SENSITIVITY) - Abnormal; Notable for the following components:   Troponin I (High Sensitivity) 23 (*)    All other components within normal limits  TROPONIN I (HIGH SENSITIVITY)    EKG EKG Interpretation  Date/Time:  Saturday July 28 2021 18:32:01 EST Ventricular Rate:  109 PR Interval:  135 QRS Duration: 86 QT Interval:  325 QTC Calculation: 438 R Axis:   -36 Text Interpretation: Sinus tachycardia Ventricular premature complex Left axis deviation Abnormal R-wave progression, late transition No previous tracing Confirmed by Gwyneth SproutPlunkett, Madalyn Legner ([id number]) on 07/28/2021 7:33:00 PM  Radiology DG Chest Port 1 View  Result Date: 07/28/2021 CLINICAL DATA:  Cough, shortness of breath EXAM: PORTABLE CHEST 1 VIEW COMPARISON:  None. FINDINGS: Heart and mediastinal contours are within normal limits. No focal opacities or effusions. No acute bony abnormality. IMPRESSION: No active disease. Electronically Signed   By: Charlett NoseKevin  Dover M.D.   On:  07/28/2021 19:20    Procedures Procedures    Medications Ordered in ED Medications  ipratropium-albuterol (DUONEB) 0.5-2.5 (3) MG/3ML nebulizer solution 3 mL (3 mLs Nebulization Not Given 07/28/21 1844)  lactated ringers infusion ( Intravenous New Bag/Given 07/28/21 1956)  ipratropium-albuterol (DUONEB) 0.5-2.5 (3) MG/3ML nebulizer solution  3 mL (3 mLs Nebulization Given 07/28/21 1844)  methylPREDNISolone sodium succinate (SOLU-MEDROL) 125 mg/2 mL injection 125 mg (125 mg Intravenous Given 07/28/21 1842)  magnesium sulfate IVPB 2 g 50 mL (0 g Intravenous Stopped 07/28/21 2016)    ED Course/ Medical Decision Making/ A&P                           Medical Decision Making Amount and/or Complexity of Data Reviewed External Data Reviewed: notes. Labs: ordered. Decision-making details documented in ED Course. Radiology: ordered and independent interpretation performed. Decision-making details documented in ED Course. ECG/medicine tests: ordered and independent interpretation performed. Decision-making details documented in ED Course.  Risk Decision regarding hospitalization.   Patient is a 61 year old male presenting today with worsening cough and shortness of breath.  Patient has now been sick for approximately 8 days.  He has been on 5 days of steroids and has been trying to use inhaler at home with minimal improvement.  Upon arrival here patient is hypoxic at 88% on room air and coughing constantly.  He has globally decreased breath sounds and mild expiratory wheezing.  He was given a DuoNeb, Solu-Medrol and magnesium.  On repeat exam he is still wheezing diffusely and sats dropped to 86% on room air and he was placed on 4 L of oxygen because at 2 L of oxygen he was still less than 90%.  Patient was on cardiac monitor continuously given his acute symptoms.  I independently interpreted patient's EKG which shows sinus tachycardia but no other acute findings.  He has no old to compare.  I independently  interpreted and evaluated patient's chest x-ray and it appears normal.  Radiology did not find any acute abnormality.  I independently ordered and evaluated and interpreted patient's labs he does have a leukocytosis of 20,000 today on his CBC which is most likely related to his recent steroid use and possibly from his infection.  Patient did test COVID-positive today and CMP otherwise within normal limits except for creatinine of 1.5 without old to compare.  Troponin mildly elevated today at 23 and will do a delta.  With patient's ongoing wheezing, oxygen requirement and COVID status we will get a VBG, give a continuous neb and given the above symptoms and increased risk of morbidity and mortality will admit for further care. Patient's length of symptoms he is not a candidate for COVID antivirals.  8:21 PM Reevaluation after treatment patient's coughing is improved.  He is still requiring 4 L of oxygen into still has some tachypnea.  O2 sats are between 90 and 95% on 4 L.  VBG without acute findings.        Final Clinical Impression(s) / ED Diagnoses Final diagnoses:  COVID  Acute respiratory failure with hypoxia [facility])    Rx / DC Orders ED Discharge Orders     None         Gwyneth Sprout, MD 07/28/21 2022

## 2021-07-28 NOTE — H&P (Signed)
History and Physical    Jesse Davidson YNW:[id number] DOB: 06/30/1961 DOA: 07/28/2021  PCP: Patient, No Pcp Per (Inactive)  Patient coming from: [facility]  I have personally briefly reviewed patient's old medical records in [facility]  Chief Complaint: Shortness of breath  HPI: Jesse Davidson is a 61 y.o. male with medical history significant for gout who presented to the ED for evaluation of persistent cough and shortness of breath.  Patient states on 07/21/2021 he developed URI symptoms with cough productive of white sputum.  He says he went to urgent care on 07/22/2021.  At the time he was tested for flu and COVID which were reportedly negative.  He was prescribed a Z-Pak, cough medication, and albuterol inhaler which she has been using regularly.  He has had persistent cough and exertional dyspnea.  He has been feeling generally fatigued.  He has had associated chills and diaphoresis, poor appetite, and decreased urine output which appears dark.  He has had chest and abdominal wall discomfort related to his frequent cough.  [facility] ED Course:  Initial vitals showed BP 129/86, pulse 104, RR 18, temp 99.5 F, SPO2 87% on room air.  Patient placed on 2 L O2 with initial improvement but desaturated to 86%, supplemental O2 increased to 4 L via [place] with improvement stable SPO2.  Labs show WBC 20.0, hemoglobin 17.9, platelets 257,000, sodium 136, potassium 4.3, bicarb 25, BUN 20, creatinine 1.50, serum glucose 106, troponin 23 > 17.  VBG showed pH 7.416, PCO2 51.8, PO2 38.  SARS-CoV-2 PCR is positive.  Influenza A/B negative.  Portable chest x-ray negative for focal consolidation, edema, effusion.  Patient was given IV Solu-Medrol 125 mg, IV magnesium 2 g, DuoNeb treatment, IV remdesivir, and IV fluids.  The hospitalist service was consulted to admit for further evaluation and management.  Review of Systems: All systems reviewed and are negative except as  documented in history of present illness above.   Past Medical History:  Diagnosis Date   Gout    Skull fracture (HCC)     Past Surgical History:  Procedure Laterality Date   APPENDECTOMY      Social History:  reports that he has never smoked. He has never used smokeless tobacco. He reports that he does not currently use alcohol. He reports that he does not use drugs.  Allergies  Allergen Reactions   Codeine Itching   Hydrocodone Itching   Prednisone Other (See Comments)    History reviewed. No pertinent family history.   Prior to Admission medications   Medication Sig Start Date End Date Taking? Authorizing Provider  allopurinol (ZYLOPRIM) 300 MG tablet Take 1 tablet (300 mg total) by mouth 2 (two) times daily. 11/13/20   Monica Bectonhekkekandam, Thomas J, MD  celecoxib (CELEBREX) 100 MG capsule Take 1 capsule (100 mg total) by mouth 2 (two) times daily. 11/11/20   Abonza, Kandis CockingMaritza, PA-C  colchicine 0.6 MG tablet colchicine 0.6 mg tablet  TAKE ONE TABLET BY MOUTH TWICE A DAY    [provider]  dexamethasone (DECADRON) 4 MG tablet Take 1 tablet (4 mg total) by mouth 3 (three) times daily. 11/13/20   Monica Bectonhekkekandam, Thomas J, MD  ondansetron (ZOFRAN ODT) 8 MG disintegrating tablet Take 1 tablet (8 mg total) by mouth every 8 (eight) hours as needed for nausea or vomiting. 02/05/21   Cathren LaineSteinl, Kevin, MD    Physical Exam: Vitals:   07/28/21 2045 07/28/21 2107 07/28/21 2230 07/28/21 2350  BP:  139/86 125/88 (!) 139/94  Pulse: 93 94 92 81  Resp: (!) 21 18 14 20   Temp:  99.9 F (37.7 C)  98 F (36.7 C)  TempSrc:  Oral  Oral  SpO2: (!) 88% 91% 93% 92%  Weight:      Height:       Constitutional: Resting in bed, NAD, calm, comfortable Eyes: PERRL, lids and conjunctivae normal ENMT: Mucous membranes are dry. Posterior pharynx clear of any exudate or lesions.Normal dentition.  Neck: normal, supple, no masses. Respiratory: clear to auscultation bilaterally, no wheezing, no crackles.   Coughs with deep inspiration.  Normal respiratory effort while on 4 L supplemental O2 via [place]. No accessory muscle use.  Cardiovascular: Regular rate and rhythm, no murmurs / rubs / gallops. No extremity edema. 2+ pedal pulses. Abdomen: no tenderness, no masses palpated. No hepatosplenomegaly. Bowel sounds positive.  Musculoskeletal: no clubbing / cyanosis. No joint deformity upper and lower extremities. Good ROM, no contractures. Normal muscle tone.  Skin: Diaphoretic, no rashes, lesions, ulcers. No induration Neurologic: CN 2-12 grossly intact. Sensation intact. Strength 5/5 in all 4.  Psychiatric: Normal judgment and insight. Alert and oriented x 3. Normal mood.   Labs on Admission: I have personally reviewed following labs and imaging studies  CBC: Recent Labs  Lab 07/28/21 1818 07/28/21 2000  WBC 20.0*  --   NEUTROABS 15.9*  --   HGB 17.9* 18.7*  HCT 54.3* 55.0*  MCV 93.1  --   PLT 257  --    Basic Metabolic Panel: Recent Labs  Lab 07/28/21 1818 07/28/21 2000  NA 136 134*  K 4.3 5.5*  CL 98  --   CO2 25  --   GLUCOSE 106*  --   BUN 20  --   CREATININE 1.50*  --   CALCIUM 8.7*  --    GFR: Estimated Creatinine Clearance: 63.1 mL/min (A) (by C-G formula based on SCr of 1.5 mg/dL (H)). Liver Function Tests: Recent Labs  Lab 07/28/21 1818  AST 33  ALT 53*  ALKPHOS 44  BILITOT 0.7  PROT 7.6  ALBUMIN 3.9   No results for input(s): LIPASE, AMYLASE in the last 168 hours. No results for input(s): AMMONIA in the last 168 hours. Coagulation Profile: No results for input(s): INR, PROTIME in the last 168 hours. Cardiac Enzymes: No results for input(s): CKTOTAL, CKMB, CKMBINDEX, TROPONINI in the last 168 hours. BNP (last 3 results) No results for input(s): PROBNP in the last 8760 hours. HbA1C: No results for input(s): HGBA1C in the last 72 hours. CBG: No results for input(s): GLUCAP in the last 168 hours. Lipid Profile: No results for input(s): CHOL, HDL, LDLCALC,  TRIG, CHOLHDL, LDLDIRECT in the last 72 hours. Thyroid Function Tests: No results for input(s): TSH, T4TOTAL, FREET4, T3FREE, THYROIDAB in the last 72 hours. Anemia Panel: No results for input(s): VITAMINB12, FOLATE, FERRITIN, TIBC, IRON, RETICCTPCT in the last 72 hours. Urine analysis:    Component Value Date/Time   COLORURINE YELLOW 02/05/2021 2337   APPEARANCEUR CLEAR 02/05/2021 2337   LABSPEC <1.005 (L) 02/05/2021 2337   PHURINE 6.0 02/05/2021 2337   GLUCOSEU NEGATIVE 02/05/2021 2337   HGBUR NEGATIVE 02/05/2021 2337   BILIRUBINUR NEGATIVE 02/05/2021 2337   KETONESUR NEGATIVE 02/05/2021 2337   PROTEINUR NEGATIVE 02/05/2021 2337   NITRITE NEGATIVE 02/05/2021 2337   LEUKOCYTESUR NEGATIVE 02/05/2021 2337    Radiological Exams on Admission: DG Chest Port 1 View  Result Date: 07/28/2021 CLINICAL DATA:  Cough, shortness of breath EXAM:  PORTABLE CHEST 1 VIEW COMPARISON:  None. FINDINGS: Heart and mediastinal contours are within normal limits. No focal opacities or effusions. No acute bony abnormality. IMPRESSION: No active disease. Electronically Signed   By: Charlett Nose M.D.   On: 07/28/2021 19:20    EKG: Personally reviewed. Sinus tachycardia, rate 109, PVCs present, late R wave transition.  No prior for comparison.  Assessment/Plan Principal Problem:   Acute respiratory failure with hypoxia (HCC) Active Problems:   COVID-19 virus infection   AKI (acute kidney injury) (HCC)   Leukocytosis   Jesse Davidson is a 61 y.o. male with medical history significant for gout who is admitted with acute respiratory failure with hypoxia due to COVID-19 viral infection.  Acute respiratory failure with hypoxia due to COVID-19 viral infection: SPO2 initially 88%, desaturated to 86% while on 2 L O2 via [place], and currently requiring 4 L O2 via [place] to keep SPO2 >92%.  CXR without evidence of pneumonia or significant infiltrate.  Suspect COVID-19 related bronchitis. -Continue IV remdesivir -Continue  IV Solu-Medrol given hypoxia -Continue Combivent, incentive spirometer, flutter valve, antitussives -Follow inflammatory markers  Acute kidney injury: Likely prerenal due to dehydration from poor oral intake.  Continue IV fluid hydration overnight and repeat labs in AM.  Leukocytosis: Likely secondary to steroid use with some element of hemoconcentration.  Repeat labs in AM.  DVT prophylaxis: Lovenox Code Status: Full code, confirmed with patient on admission Family Communication: Patient states he has no close family, he has notified his close acquaintances. Disposition Plan: From home and likely discharge to home pending clinical progress. Consults called: None Level of care: Med-Surg Admission status:  Status is: Inpatient  Remains inpatient appropriate because: Remains hypoxic requiring 4 L supplemental O2 via [place] to maintain SPO2 >88%.   Darreld Mclean MD [facility]  If 7PM-7AM, please contact night-coverage [email or web address]  07/29/2021, 12:43 AM

## 2021-07-28 NOTE — ED Notes (Signed)
Due to patient's SpO2 maintaining at 86% on 2LNC, literlfow increased to 4L/min. RN aware

## 2021-07-28 NOTE — Progress Notes (Signed)
  [facility] will assume care on arrival to accepting facility. Until arrival, care as per EDP. However, [facility] available 24/7 for questions and assistance.   Nursing staff please page [facility] Admits and Consults ([phone]) as soon as the patient arrives to the hospital.  Millee Denise, DO [facility]  

## 2021-07-29 DIAGNOSIS — E669 Obesity, unspecified: Secondary | ICD-10-CM

## 2021-07-29 DIAGNOSIS — N179 Acute kidney failure, unspecified: Secondary | ICD-10-CM

## 2021-07-29 DIAGNOSIS — U071 COVID-19: Principal | ICD-10-CM

## 2021-07-29 LAB — CBC WITH DIFFERENTIAL/PLATELET
Abs Immature Granulocytes: 0.15 10*3/uL — ABNORMAL HIGH (ref 0.00–0.07)
Basophils Absolute: 0.1 10*3/uL (ref 0.0–0.1)
Basophils Relative: 0 %
Eosinophils Absolute: 0 10*3/uL (ref 0.0–0.5)
Eosinophils Relative: 0 %
HCT: 51.4 % (ref 39.0–52.0)
Hemoglobin: 16.8 g/dL (ref 13.0–17.0)
Immature Granulocytes: 1 %
Lymphocytes Relative: 9 %
Lymphs Abs: 1 10*3/uL (ref 0.7–4.0)
MCH: 31.2 pg (ref 26.0–34.0)
MCHC: 32.7 g/dL (ref 30.0–36.0)
MCV: 95.5 fL (ref 80.0–100.0)
Monocytes Absolute: 0.4 10*3/uL (ref 0.1–1.0)
Monocytes Relative: 4 %
Neutro Abs: 10.5 10*3/uL — ABNORMAL HIGH (ref 1.7–7.7)
Neutrophils Relative %: 86 %
Platelets: 196 10*3/uL (ref 150–400)
RBC: 5.38 MIL/uL (ref 4.22–5.81)
RDW: 12.7 % (ref 11.5–15.5)
WBC: 12.2 10*3/uL — ABNORMAL HIGH (ref 4.0–10.5)
nRBC: 0 % (ref 0.0–0.2)

## 2021-07-29 LAB — COMPREHENSIVE METABOLIC PANEL
ALT: 50 U/L — ABNORMAL HIGH (ref 0–44)
AST: 34 U/L (ref 15–41)
Albumin: 3.3 g/dL — ABNORMAL LOW (ref 3.5–5.0)
Alkaline Phosphatase: 39 U/L (ref 38–126)
Anion gap: 13 (ref 5–15)
BUN: 25 mg/dL — ABNORMAL HIGH (ref 6–20)
CO2: 27 mmol/L (ref 22–32)
Calcium: 8.8 mg/dL — ABNORMAL LOW (ref 8.9–10.3)
Chloride: 97 mmol/L — ABNORMAL LOW (ref 98–111)
Creatinine, Ser: 1.21 mg/dL (ref 0.61–1.24)
GFR, Estimated: >60 mL/min (ref >60)
Glucose, Bld: 187 mg/dL — ABNORMAL HIGH (ref 70–99)
Potassium: 4.3 mmol/L (ref 3.5–5.1)
Sodium: 137 mmol/L (ref 135–145)
Total Bilirubin: 0.6 mg/dL (ref 0.3–1.2)
Total Protein: 7 g/dL (ref 6.5–8.1)

## 2021-07-29 LAB — HIV ANTIBODY (ROUTINE TESTING W REFLEX): HIV Screen 4th Generation wRfx: NONREACTIVE

## 2021-07-29 LAB — C-REACTIVE PROTEIN: CRP: 9.9 mg/dL — ABNORMAL HIGH (ref <1.0)

## 2021-07-29 LAB — PROCALCITONIN: Procalcitonin: 1.17 ng/mL

## 2021-07-29 LAB — PHOSPHORUS: Phosphorus: 4 mg/dL (ref 2.5–4.6)

## 2021-07-29 LAB — MAGNESIUM: Magnesium: 2.5 mg/dL — ABNORMAL HIGH (ref 1.7–2.4)

## 2021-07-29 LAB — D-DIMER, QUANTITATIVE: D-Dimer, Quant: <0.27 ug/mL-FEU (ref 0.00–0.50)

## 2021-07-29 LAB — FERRITIN: Ferritin: 620 ng/mL — ABNORMAL HIGH (ref 24–336)

## 2021-07-29 MED ORDER — PREDNISONE 5 MG PO TABS: 50.0000 mg | ORAL_TABLET | Freq: Every day | ORAL | Status: DC

## 2021-07-29 MED ORDER — SENNOSIDES-DOCUSATE SODIUM 8.6-50 MG PO TABS: 1.0000 | ORAL_TABLET | Freq: Every evening | ORAL | Status: DC | PRN

## 2021-07-29 MED ORDER — ONDANSETRON HCL 4 MG/2ML IJ SOLN: 4.0000 mg | Freq: Four times a day (QID) | INTRAMUSCULAR | Status: DC | PRN

## 2021-07-29 MED ORDER — ONDANSETRON HCL 4 MG PO TABS: 4.0000 mg | ORAL_TABLET | Freq: Four times a day (QID) | ORAL | Status: DC | PRN

## 2021-07-29 MED ORDER — ZINC SULFATE 220 (50 ZN) MG PO CAPS
220.0000 mg | ORAL_CAPSULE | Freq: Every day | ORAL | Status: DC
  Administered 2021-07-29 – 2021-07-31 (×3): 220 mg via ORAL
  Filled 2021-07-29 (×3): qty 1

## 2021-07-29 MED ORDER — IPRATROPIUM-ALBUTEROL 20-100 MCG/ACT IN AERS
1.0000 | INHALATION_SPRAY | Freq: Four times a day (QID) | RESPIRATORY_TRACT | Status: DC
  Administered 2021-07-29 – 2021-07-31 (×10): 1 via RESPIRATORY_TRACT
  Filled 2021-07-29: qty 4

## 2021-07-29 MED ORDER — METHYLPREDNISOLONE SODIUM SUCC 125 MG IJ SOLR
60.0000 mg | Freq: Two times a day (BID) | INTRAMUSCULAR | Status: DC
  Administered 2021-07-29 – 2021-07-31 (×5): 60 mg via INTRAVENOUS
  Filled 2021-07-29 (×5): qty 2

## 2021-07-29 MED ORDER — BENZONATATE 100 MG PO CAPS
200.0000 mg | ORAL_CAPSULE | Freq: Three times a day (TID) | ORAL | Status: DC | PRN
  Administered 2021-07-29 – 2021-07-31 (×4): 200 mg via ORAL
  Filled 2021-07-29 (×4): qty 2

## 2021-07-29 MED ORDER — HYDROCODONE BIT-HOMATROP MBR 5-1.5 MG/5ML PO SOLN: 5.0000 mL | ORAL | Status: DC | PRN

## 2021-07-29 MED ORDER — ENOXAPARIN SODIUM 60 MG/0.6ML IJ SOSY
55.0000 mg | PREFILLED_SYRINGE | INTRAMUSCULAR | Status: DC
  Administered 2021-07-29 – 2021-07-30 (×2): 55 mg via SUBCUTANEOUS
  Filled 2021-07-29 (×3): qty 0.6

## 2021-07-29 MED ORDER — GUAIFENESIN-DM 100-10 MG/5ML PO SYRP
10.0000 mL | ORAL_SOLUTION | ORAL | Status: DC | PRN
  Administered 2021-07-30 – 2021-07-31 (×3): 10 mL via ORAL
  Filled 2021-07-29 (×4): qty 10

## 2021-07-29 MED ORDER — ASCORBIC ACID 500 MG PO TABS
500.0000 mg | ORAL_TABLET | Freq: Every day | ORAL | Status: DC
  Administered 2021-07-29 – 2021-07-31 (×3): 500 mg via ORAL
  Filled 2021-07-29 (×3): qty 1

## 2021-07-29 MED ORDER — COLCHICINE 0.6 MG PO TABS
0.6000 mg | ORAL_TABLET | Freq: Two times a day (BID) | ORAL | Status: DC
  Administered 2021-07-30: 0.6 mg via ORAL
  Filled 2021-07-29 (×3): qty 1

## 2021-07-29 MED ORDER — ACETAMINOPHEN 325 MG PO TABS: 650.0000 mg | ORAL_TABLET | Freq: Four times a day (QID) | ORAL | Status: DC | PRN

## 2021-07-29 MED ORDER — ALLOPURINOL 300 MG PO TABS
300.0000 mg | ORAL_TABLET | Freq: Two times a day (BID) | ORAL | Status: DC
  Administered 2021-07-29 – 2021-07-31 (×4): 300 mg via ORAL
  Filled 2021-07-29 (×5): qty 1

## 2021-07-29 NOTE — Assessment & Plan Note (Addendum)
No associated pneumonia on chest x-ray. CRP elevated. Started on Remdesivir and steroids. CRP trending down. Patient completed 4 doses of Remdesivir. He was transitioned from Solu-medrol to prednisone and will complete a 10 day total steroid burst.

## 2021-07-29 NOTE — Assessment & Plan Note (Signed)
Mild. Resolved with IV fluids. 

## 2021-07-29 NOTE — Progress Notes (Signed)
Jesse Davidson  [id number] DOB: 1960-11-22 DOA: 07/28/2021 PCP: Patient, No Pcp Per (Inactive)   Brief Narrative: Jesse Davidson is a 61 y.o. male with a history of gout. He presented secondary to shortness of breath and was admitted with acute respiratory failure with hypoxia secondary to COVID-19 infection with no evidence of pneumonia on chest x-ray. Remdesivir and steroids started for treatment. Oxygen supplementation provided for support.   Assessment & Plan:   * Acute respiratory failure with hypoxia ([place])- (present on admission) SpO2 down to 88%. Secondary to COVID pneumonia. Oxygen therapy initiated -Continue oxygen, keep SpO2 > 92% -Wean to room air as able; ambulatory pulse ox daily  COVID-19 virus infection- (present on admission) No associated pneumonia on chest x-ray. CRP elevated. Started on Remdesivir -Continue Remdesivir -Start Solu-medrol with eventual transition to prednisone --Daily CMP, CBC, Ferritin, D-dimer, CRP  Obesity (BMI 30-39.9)- (present on admission) Body mass index is 38.37 kg/m.  AKI (acute kidney injury) (HCC)-resolved as of 07/29/2021, (present on admission) Mild. Resolved with IV fluids.      -----    DVT prophylaxis: Lovenox Code Status:   Code Status: Full Code Family Communication: None at bedside Disposition Plan: Discharge home likely in 1-4 days pending completion of Remdesivir, wean oxygen as able, improvement of labs   Consultants:  None  Procedures:  None  Antimicrobials: Remdesivir    Subjective: Cough. Breathing has improved.  Objective: Vitals:   07/28/21 2107 07/28/21 2230 07/28/21 2350 07/29/21 0402  BP: 139/86 125/88 (!) 139/94 125/81  Pulse: 94 92 81 72  Resp: 18 14 20 17   Temp: 99.9 F (37.7 C)  98 F (36.7 C) 97.9 F (36.6 C)  TempSrc: Oral  Oral Axillary  SpO2: 91% 93% 92% 92%  Weight:      Height:        Intake/Output Summary (Last 24 hours) at 07/29/2021 1046 Last data  filed at 07/29/2021 0800 Gross per 24 hour  Intake 1106.18 ml  Output --  Net 1106.18 ml   Filed Weights   07/28/21 1807  Weight: 111.1 kg    Examination:  General exam: Appears calm and comfortable Respiratory system: Clear/diminished on auscultation. Respiratory effort normal. Cardiovascular system: S1 & S2 heard, RRR. No murmurs, rubs, gallops or clicks. Gastrointestinal system: Abdomen is nondistended, soft and nontender. No organomegaly or masses felt. Normal bowel sounds heard. Central nervous system: Alert and oriented. No focal neurological deficits. Musculoskeletal: No edema. No calf tenderness Skin: No cyanosis. No rashes Psychiatry: Judgement and insight appear normal. Mood & affect appropriate.     Data Reviewed: I have personally reviewed following labs and imaging studies  CBC Lab Results  Component Value Date   WBC 12.2 (H) 07/29/2021   RBC 5.38 07/29/2021   HGB 16.8 07/29/2021   HCT 51.4 07/29/2021   MCV 95.5 07/29/2021   MCH 31.2 07/29/2021   PLT 196 07/29/2021   MCHC 32.7 07/29/2021   RDW 12.7 07/29/2021   LYMPHSABS 1.0 07/29/2021   MONOABS 0.4 07/29/2021   EOSABS 0.0 07/29/2021   BASOSABS 0.1 123456     Last metabolic panel Lab Results  Component Value Date   NA 137 07/29/2021   K 4.3 07/29/2021   CL 97 (L) 07/29/2021   CO2 27 07/29/2021   BUN 25 (H) 07/29/2021   CREATININE 1.21 07/29/2021   GLUCOSE 187 (H) 07/29/2021   GFRNONAA >60 07/29/2021   CALCIUM 8.8 (L) 07/29/2021   PHOS 4.0 07/29/2021  PROT 7.0 07/29/2021   ALBUMIN 3.3 (L) 07/29/2021   BILITOT 0.6 07/29/2021   ALKPHOS 39 07/29/2021   AST 34 07/29/2021   ALT 50 (H) 07/29/2021   ANIONGAP 13 07/29/2021    CBG (last 3)  No results for input(s): GLUCAP in the last 72 hours.   GFR: Estimated Creatinine Clearance: 78.2 mL/min (by C-G formula based on SCr of 1.21 mg/dL).  Coagulation Profile: No results for input(s): INR, PROTIME in the last 168 hours.  Recent Results  (from the past 240 hour(s))  Resp Panel by RT-PCR (Flu A&B, Covid) Nasopharyngeal Swab     Status: Abnormal   Collection Time: 07/28/21  6:16 PM   Specimen: Nasopharyngeal Swab; Nasopharyngeal(NP) swabs in vial transport medium  Result Value Ref Range Status   SARS Coronavirus 2 by RT PCR POSITIVE (A) NEGATIVE Final    Comment: (NOTE) SARS-CoV-2 target nucleic acids are DETECTED.  The SARS-CoV-2 RNA is generally detectable in upper respiratory specimens during the acute phase of infection. Positive results are indicative of the presence of the identified virus, but do not rule out bacterial infection or co-infection with other pathogens not detected by the test. Clinical correlation with patient history and other diagnostic information is necessary to determine patient infection status. The expected result is Negative.  Fact Sheet for Patients: BloggerCourse.comhttps://www.fda.gov/media/152166/download  Fact Sheet for Healthcare Providers: SeriousBroker.ithttps://www.fda.gov/media/152162/download  This test is not yet approved or cleared by the [place] FDA and  has been authorized for detection and/or diagnosis of SARS-CoV-2 by FDA under an Emergency Use Authorization (EUA).  This EUA will remain in effect (meaning this test can be used) for the duration of  the COVID-19 declaration under Section 564(b)(1) of the A ct, 21 U.S.C. section 360bbb-3(b)(1), unless the authorization is terminated or revoked sooner.     Influenza A by PCR NEGATIVE NEGATIVE Final   Influenza B by PCR NEGATIVE NEGATIVE Final    Comment: (NOTE) The Xpert Xpress SARS-CoV-2/FLU/RSV plus assay is intended as an aid in the diagnosis of influenza from Nasopharyngeal swab specimens and should not be used as a sole basis for treatment. Nasal washings and aspirates are unacceptable for Xpert Xpress SARS-CoV-2/FLU/RSV testing.  Fact Sheet for Patients: BloggerCourse.comhttps://www.fda.gov/media/152166/download  Fact Sheet for Healthcare  Providers: SeriousBroker.ithttps://www.fda.gov/media/152162/download  This test is not yet approved or cleared by the [place] FDA and has been authorized for detection and/or diagnosis of SARS-CoV-2 by FDA under an Emergency Use Authorization (EUA). This EUA will remain in effect (meaning this test can be used) for the duration of the COVID-19 declaration under Section 564(b)(1) of the Act, 21 U.S.C. section 360bbb-3(b)(1), unless the authorization is terminated or revoked.  Performed at [facility], [address], [place], [place] [id number]         Radiology Studies: [facility]  Result Date: 07/28/2021 CLINICAL DATA:  Cough, shortness of breath EXAM: PORTABLE CHEST 1 VIEW COMPARISON:  None. FINDINGS: Heart and mediastinal contours are within normal limits. No focal opacities or effusions. No acute bony abnormality. IMPRESSION: No active disease. Electronically Signed   By: Charlett NoseKevin  Dover M.D.   On: 07/28/2021 19:20        Scheduled Meds:  allopurinol  300 mg Oral BID   vitamin C  500 mg Oral Daily   colchicine  0.6 mg Oral BID   enoxaparin (LOVENOX) injection  55 mg Subcutaneous Q24H   Ipratropium-Albuterol  1 puff Inhalation Q6H   methylPREDNISolone (SOLU-MEDROL) injection  60 mg Intravenous  Q12H   Followed by   Derrill Memo ON 08/01/2021] predniSONE  50 mg Oral Daily   zinc sulfate  220 mg Oral Daily   Continuous Infusions:  remdesivir 100 mg in NS 100 mL       LOS: 1 day     Cordelia Poche, MD [facility] 07/29/2021, 10:46 AM  If 7PM-7AM, please contact night-coverage [email or web address]

## 2021-07-29 NOTE — Assessment & Plan Note (Signed)
Body mass index is 38.37 kg/m.

## 2021-07-29 NOTE — Assessment & Plan Note (Addendum)
SpO2 down to 88%. Secondary to COVID infection. Oxygen therapy initiated. Patient required oxygen with ambulation and was set up with home oxygen.

## 2021-07-29 NOTE — Hospital Course (Signed)
Jesse Davidson is a 61 y.o. male with a history of gout. He presented secondary to shortness of breath and was admitted with acute respiratory failure with hypoxia secondary to COVID-19 infection with no evidence of pneumonia on chest x-ray. Remdesivir and steroids started for treatment. Oxygen supplementation provided for support.

## 2021-07-30 ENCOUNTER — Inpatient Hospital Stay (HOSPITAL_COMMUNITY): Payer: Self-pay

## 2021-07-30 LAB — CBC WITH DIFFERENTIAL/PLATELET
Abs Immature Granulocytes: 0.3 10*3/uL — ABNORMAL HIGH (ref 0.00–0.07)
Basophils Absolute: 0.1 10*3/uL (ref 0.0–0.1)
Basophils Relative: 0 %
Eosinophils Absolute: 0 10*3/uL (ref 0.0–0.5)
Eosinophils Relative: 0 %
HCT: 51.2 % (ref 39.0–52.0)
Hemoglobin: 16.7 g/dL (ref 13.0–17.0)
Immature Granulocytes: 1 %
Lymphocytes Relative: 6 %
Lymphs Abs: 1.5 10*3/uL (ref 0.7–4.0)
MCH: 31 pg (ref 26.0–34.0)
MCHC: 32.6 g/dL (ref 30.0–36.0)
MCV: 95.2 fL (ref 80.0–100.0)
Monocytes Absolute: 1.3 10*3/uL — ABNORMAL HIGH (ref 0.1–1.0)
Monocytes Relative: 6 %
Neutro Abs: 20.1 10*3/uL — ABNORMAL HIGH (ref 1.7–7.7)
Neutrophils Relative %: 87 %
Platelets: 226 10*3/uL (ref 150–400)
RBC: 5.38 MIL/uL (ref 4.22–5.81)
RDW: 12.7 % (ref 11.5–15.5)
WBC: 23.3 10*3/uL — ABNORMAL HIGH (ref 4.0–10.5)
nRBC: 0 % (ref 0.0–0.2)

## 2021-07-30 LAB — COMPREHENSIVE METABOLIC PANEL
ALT: 48 U/L — ABNORMAL HIGH (ref 0–44)
AST: 32 U/L (ref 15–41)
Albumin: 3.3 g/dL — ABNORMAL LOW (ref 3.5–5.0)
Alkaline Phosphatase: 39 U/L (ref 38–126)
Anion gap: 5 (ref 5–15)
BUN: 32 mg/dL — ABNORMAL HIGH (ref 6–20)
CO2: 26 mmol/L (ref 22–32)
Calcium: 8.6 mg/dL — ABNORMAL LOW (ref 8.9–10.3)
Chloride: 102 mmol/L (ref 98–111)
Creatinine, Ser: 1.15 mg/dL (ref 0.61–1.24)
GFR, Estimated: >60 mL/min (ref >60)
Glucose, Bld: 153 mg/dL — ABNORMAL HIGH (ref 70–99)
Potassium: 4.7 mmol/L (ref 3.5–5.1)
Sodium: 133 mmol/L — ABNORMAL LOW (ref 135–145)
Total Bilirubin: 0.6 mg/dL (ref 0.3–1.2)
Total Protein: 7 g/dL (ref 6.5–8.1)

## 2021-07-30 LAB — MAGNESIUM: Magnesium: 2.2 mg/dL (ref 1.7–2.4)

## 2021-07-30 LAB — C-REACTIVE PROTEIN: CRP: 5.3 mg/dL — ABNORMAL HIGH (ref <1.0)

## 2021-07-30 LAB — FERRITIN: Ferritin: 613 ng/mL — ABNORMAL HIGH (ref 24–336)

## 2021-07-30 LAB — PHOSPHORUS: Phosphorus: 3.8 mg/dL (ref 2.5–4.6)

## 2021-07-30 LAB — D-DIMER, QUANTITATIVE: D-Dimer, Quant: <0.27 ug/mL-FEU (ref 0.00–0.50)

## 2021-07-30 MED ORDER — HYDROXYZINE HCL 25 MG PO TABS: 25.0000 mg | ORAL_TABLET | Freq: Three times a day (TID) | ORAL | Status: DC | PRN

## 2021-07-30 NOTE — Progress Notes (Addendum)
PROGRESS NOTE    Jesse Davidson  [id number] DOB: [birth year] DOA: 07/28/2021 PCP: Patient, No Pcp Per (Inactive)   Brief Narrative: DANFORD WORTHINGTON is a 61 y.o. male with a history of gout. He presented secondary to shortness of breath and was admitted with acute respiratory failure with hypoxia secondary to COVID-19 infection with no evidence of pneumonia on chest x-ray. Remdesivir and steroids started for treatment. Oxygen supplementation provided for support.   Assessment & Plan:   * Acute respiratory failure with hypoxia (Advance)- (present on admission) SpO2 down to 88%. Secondary to COVID infection. Oxygen therapy initiated -Continue oxygen, keep SpO2 > 92% -Wean to room air as able; ambulatory pulse ox daily  Leukocytosis- (present on admission) Likely secondary to steroids. No associated fevers or worsening clinical symptoms.  COVID-19 virus infection- (present on admission) No associated pneumonia on chest x-ray. CRP elevated. Started on Remdesivir and steroids. CRP trending down. -Continue Remdesivir -Solu-medrol with eventual transition to prednisone --Daily CMP, CBC, Ferritin, D-dimer, CRP  Obesity (BMI 30-39.9)- (present on admission) Body mass index is 38.37 kg/m.  AKI (acute kidney injury) (HCC)-resolved as of 07/29/2021, (present on admission) Mild. Resolved with IV fluids.      DVT prophylaxis: Lovenox Code Status:   Code Status: Full Code Family Communication: None at bedside Disposition Plan: Discharge home likely in 1-3 days pending completion of Remdesivir, wean oxygen as able, improvement of labs   Consultants:  None  Procedures:  None  Antimicrobials: Remdesivir    Subjective: Continues to have significant coughing.  Objective: Vitals:   07/29/21 0402 07/29/21 1335 07/29/21 2155 07/30/21 0459  BP: 125/81 (!) 148/96 122/84 (!) 158/98  Pulse: 72 72 91 77  Resp: 17 17 17 17   Temp: 97.9 F (36.6 C) 98.5 F (36.9 C) 99 F (37.2 C)  98.6 F (37 C)  TempSrc: Axillary Oral Oral Oral  SpO2: 92% 91% 91% 91%  Weight:      Height:        Intake/Output Summary (Last 24 hours) at 07/30/2021 1231 Last data filed at 07/30/2021 1100 Gross per 24 hour  Intake 1280 ml  Output --  Net 1280 ml    Filed Weights   07/28/21 1807  Weight: 111.1 kg    Examination:  General exam: Appears calm and comfortable Respiratory system: Clear/diminished to auscultation. Respiratory effort normal. Cardiovascular system: S1 & S2 heard, RRR. No murmurs, rubs, gallops or clicks. Gastrointestinal system: Abdomen is distended, soft and nontender. No organomegaly or masses felt. Normal bowel sounds heard. Central nervous system: Alert and oriented. No focal neurological deficits. Musculoskeletal: No edema. No calf tenderness Skin: No cyanosis. No rashes Psychiatry: Judgement and insight appear normal. Mood & affect appropriate.     Data Reviewed: I have personally reviewed following labs and imaging studies  CBC Lab Results  Component Value Date   WBC 23.3 (H) 07/30/2021   RBC 5.38 07/30/2021   HGB 16.7 07/30/2021   HCT 51.2 07/30/2021   MCV 95.2 07/30/2021   MCH 31.0 07/30/2021   PLT 226 07/30/2021   MCHC 32.6 07/30/2021   RDW 12.7 07/30/2021   LYMPHSABS 1.5 07/30/2021   MONOABS 1.3 (H) 07/30/2021   EOSABS 0.0 07/30/2021   BASOSABS 0.1 [id number]     Last metabolic panel Lab Results  Component Value Date   NA 133 (L) 07/30/2021   K 4.7 07/30/2021   CL 102 07/30/2021   CO2 26 07/30/2021   BUN 32 (H) 07/30/2021   CREATININE 1.15  07/30/2021   GLUCOSE 153 (H) 07/30/2021   GFRNONAA >60 07/30/2021   CALCIUM 8.6 (L) 07/30/2021   PHOS 3.8 07/30/2021   PROT 7.0 07/30/2021   ALBUMIN 3.3 (L) 07/30/2021   BILITOT 0.6 07/30/2021   ALKPHOS 39 07/30/2021   AST 32 07/30/2021   ALT 48 (H) 07/30/2021   ANIONGAP 5 07/30/2021    CBG (last 3)  No results for input(s): GLUCAP in the last 72 hours.   GFR: Estimated Creatinine  Clearance: 82.3 mL/min (by C-G formula based on SCr of 1.15 mg/dL).  Coagulation Profile: No results for input(s): INR, PROTIME in the last 168 hours.  Recent Results (from the past 240 hour(s))  Resp Panel by RT-PCR (Flu A&B, Covid) Nasopharyngeal Swab     Status: Abnormal   Collection Time: 07/28/21  6:16 PM   Specimen: Nasopharyngeal Swab; Nasopharyngeal(NP) swabs in vial transport medium  Result Value Ref Range Status   SARS Coronavirus 2 by RT PCR POSITIVE (A) NEGATIVE Final    Comment: (NOTE) SARS-CoV-2 target nucleic acids are DETECTED.  The SARS-CoV-2 RNA is generally detectable in upper respiratory specimens during the acute phase of infection. Positive results are indicative of the presence of the identified virus, but do not rule out bacterial infection or co-infection with other pathogens not detected by the test. Clinical correlation with patient history and other diagnostic information is necessary to determine patient infection status. The expected result is Negative.  Fact Sheet for Patients: EntrepreneurPulse.com.au  Fact Sheet for Healthcare Providers: IncredibleEmployment.be  This test is not yet approved or cleared by the [place] FDA and  has been authorized for detection and/or diagnosis of SARS-CoV-2 by FDA under an Emergency Use Authorization (EUA).  This EUA will remain in effect (meaning this test can be used) for the duration of  the COVID-19 declaration under Section 564(b)(1) of the A ct, 21 U.S.C. section 360bbb-3(b)(1), unless the authorization is terminated or revoked sooner.     Influenza A by PCR NEGATIVE NEGATIVE Final   Influenza B by PCR NEGATIVE NEGATIVE Final    Comment: (NOTE) The Xpert Xpress SARS-CoV-2/FLU/RSV plus assay is intended as an aid in the diagnosis of influenza from Nasopharyngeal swab specimens and should not be used as a sole basis for treatment. Nasal washings and aspirates are  unacceptable for Xpert Xpress SARS-CoV-2/FLU/RSV testing.  Fact Sheet for Patients: EntrepreneurPulse.com.au  Fact Sheet for Healthcare Providers: IncredibleEmployment.be  This test is not yet approved or cleared by the [place] FDA and has been authorized for detection and/or diagnosis of SARS-CoV-2 by FDA under an Emergency Use Authorization (EUA). This EUA will remain in effect (meaning this test can be used) for the duration of the COVID-19 declaration under Section 564(b)(1) of the Act, 21 U.S.C. section 360bbb-3(b)(1), unless the authorization is terminated or revoked.  Performed at [facility], [address], [place], [place] [address]         Radiology Studies: [facility] Chest [facility] 1 View  Result Date: 07/28/2021 CLINICAL DATA:  Cough, shortness of breath EXAM: PORTABLE CHEST 1 VIEW COMPARISON:  None. FINDINGS: Heart and mediastinal contours are within normal limits. No focal opacities or effusions. No acute bony abnormality. IMPRESSION: No active disease. Electronically Signed   By: Rolm Baptise M.D.   On: 07/28/2021 19:20        Scheduled Meds:  allopurinol  300 mg Oral BID   vitamin C  500 mg Oral Daily   enoxaparin (LOVENOX) injection  55 mg Subcutaneous Q24H  Ipratropium-Albuterol  1 puff Inhalation Q6H   methylPREDNISolone (SOLU-MEDROL) injection  60 mg Intravenous Q12H   Followed by   Derrill Memo ON 08/01/2021] predniSONE  50 mg Oral Daily   zinc sulfate  220 mg Oral Daily   Continuous Infusions:  remdesivir 100 mg in NS 100 mL 100 mg (07/30/21 0929)     LOS: 2 days     Cordelia Poche, MD [facility] 07/30/2021, 12:31 PM  If 7PM-7AM, please contact night-coverage [email or web address]

## 2021-07-30 NOTE — Progress Notes (Addendum)
Room air saturation at rest 95%.  On ambulation, saturation dropped to 81% lowest during the first few minutes of activity, then went up to 84-86% in the middle of activity, no distress.  Upon resting, saturation went back up to 92%.  Needs addressed.

## 2021-07-30 NOTE — Assessment & Plan Note (Addendum)
Likely secondary to steroids. No associated fevers or worsening clinical symptoms. Stable.

## 2021-07-31 LAB — CBC WITH DIFFERENTIAL/PLATELET
Abs Immature Granulocytes: 0.55 10*3/uL — ABNORMAL HIGH (ref 0.00–0.07)
Basophils Absolute: 0.1 10*3/uL (ref 0.0–0.1)
Basophils Relative: 0 %
Eosinophils Absolute: 0 10*3/uL (ref 0.0–0.5)
Eosinophils Relative: 0 %
HCT: 50.6 % (ref 39.0–52.0)
Hemoglobin: 16.9 g/dL (ref 13.0–17.0)
Immature Granulocytes: 3 %
Lymphocytes Relative: 8 %
Lymphs Abs: 1.6 10*3/uL (ref 0.7–4.0)
MCH: 31.8 pg (ref 26.0–34.0)
MCHC: 33.4 g/dL (ref 30.0–36.0)
MCV: 95.1 fL (ref 80.0–100.0)
Monocytes Absolute: 0.8 10*3/uL (ref 0.1–1.0)
Monocytes Relative: 4 %
Neutro Abs: 17 10*3/uL — ABNORMAL HIGH (ref 1.7–7.7)
Neutrophils Relative %: 85 %
Platelets: 258 10*3/uL (ref 150–400)
RBC: 5.32 MIL/uL (ref 4.22–5.81)
RDW: 12.8 % (ref 11.5–15.5)
WBC: 20 10*3/uL — ABNORMAL HIGH (ref 4.0–10.5)
nRBC: 0 % (ref 0.0–0.2)

## 2021-07-31 LAB — COMPREHENSIVE METABOLIC PANEL
ALT: 53 U/L — ABNORMAL HIGH (ref 0–44)
AST: 35 U/L (ref 15–41)
Albumin: 3.2 g/dL — ABNORMAL LOW (ref 3.5–5.0)
Alkaline Phosphatase: 41 U/L (ref 38–126)
Anion gap: 8 (ref 5–15)
BUN: 29 mg/dL — ABNORMAL HIGH (ref 6–20)
CO2: 24 mmol/L (ref 22–32)
Calcium: 8.8 mg/dL — ABNORMAL LOW (ref 8.9–10.3)
Chloride: 101 mmol/L (ref 98–111)
Creatinine, Ser: 1.15 mg/dL (ref 0.61–1.24)
GFR, Estimated: >60 mL/min (ref >60)
Glucose, Bld: 171 mg/dL — ABNORMAL HIGH (ref 70–99)
Potassium: 4.7 mmol/L (ref 3.5–5.1)
Sodium: 133 mmol/L — ABNORMAL LOW (ref 135–145)
Total Bilirubin: 0.7 mg/dL (ref 0.3–1.2)
Total Protein: 6.8 g/dL (ref 6.5–8.1)

## 2021-07-31 LAB — C-REACTIVE PROTEIN: CRP: 1.8 mg/dL — ABNORMAL HIGH (ref <1.0)

## 2021-07-31 LAB — D-DIMER, QUANTITATIVE: D-Dimer, Quant: <0.27 ug/mL-FEU (ref 0.00–0.50)

## 2021-07-31 LAB — FERRITIN: Ferritin: 601 ng/mL — ABNORMAL HIGH (ref 24–336)

## 2021-07-31 LAB — MAGNESIUM: Magnesium: 2 mg/dL (ref 1.7–2.4)

## 2021-07-31 LAB — PHOSPHORUS: Phosphorus: 4.5 mg/dL (ref 2.5–4.6)

## 2021-07-31 MED ORDER — OLOPATADINE HCL 0.1 % OP SOLN
1.0000 [drp] | Freq: Two times a day (BID) | OPHTHALMIC | Status: DC
  Filled 2021-07-31: qty 5

## 2021-07-31 MED ORDER — OLOPATADINE HCL 0.1 % OP SOLN: 1.0000 [drp] | Freq: Two times a day (BID) | OPHTHALMIC | 0 refills | Status: AC

## 2021-07-31 MED ORDER — PREDNISONE 50 MG PO TABS: 50.0000 mg | ORAL_TABLET | Freq: Every day | ORAL | 0 refills | Status: AC

## 2021-07-31 MED ORDER — ZINC SULFATE 220 (50 ZN) MG PO CAPS: 220.0000 mg | ORAL_CAPSULE | Freq: Every day | ORAL | 0 refills | Status: AC

## 2021-07-31 MED ORDER — BENZONATATE 200 MG PO CAPS: 200.0000 mg | ORAL_CAPSULE | Freq: Three times a day (TID) | ORAL | 0 refills | Status: AC | PRN

## 2021-07-31 NOTE — Discharge Instructions (Addendum)
Jesse Davidson,  You were in the hospital with coughing and hypoxia (low oxygen) related to your COVID-19 infection. Thankfully there was no evidence of pneumonia. You were treated with Remdesivir and steroids. Please continue your steroids as prescribed. You can discontinue isolation on 08/07/2021

## 2021-07-31 NOTE — TOC Transition Note (Signed)
Transition of Care [facility]) - CM/SW Discharge Note  Patient Details  Name: ZENITH WILL MRN: [id number] Date of Birth: Mar 13, 1961  [facility] of [facility]) CM/SW Contact:  Sherie Don, LCSW Phone Number: 07/31/2021, 2:59 PM  Clinical Narrative: Patient will need charity O2. Referral made to [facility] with [facility]. CSW updated patient. TOC signing off.  Final next level of care: Home/Self Care Barriers to Discharge: Barriers Resolved  Discharge Plan and Services      DME Arranged: Oxygen DME Agency: [facility] Date DME Agency Contacted: 07/31/21 Representative spoke with at DME Agency: Andee Poles  Readmission Risk Interventions No flowsheet data found.

## 2021-07-31 NOTE — Progress Notes (Signed)
SATURATION QUALIFICATIONS: (This note is used to comply with regulatory documentation for home oxygen)  Patient Saturations on Room Air at Rest = 92%  Patient Saturations on Room Air while Ambulating = 84%  Patient Saturations on 2 Liters of oxygen while Ambulating = 95%  Please briefly explain why patient needs home oxygen: patient desaturation to 84% on room air on exertion and ambulation.

## 2021-07-31 NOTE — Progress Notes (Addendum)
Patient ambulated for 30 minutes in the room, ambulatory saturation 84% on room air.  At rest, saturation went up to 92% on room air.  Slightly SOB on exertion, no distress.

## 2021-07-31 NOTE — Discharge Summary (Signed)
Physician Discharge Summary  Jesse Davidson Quesnell ZOX:[id number] DOB: 09/23/1960 DOA: 07/28/2021  PCP: Patient, No Pcp Per (Inactive)  Admit date: 07/28/2021 Discharge date: 07/31/2021  Admitted From: Home Disposition: Home  Recommendations for Outpatient Follow-up:  Follow up with PCP in 1 week Please follow up on the following pending results: None  Home Health: None Equipment/Devices: Oxygen  Discharge Condition: Stable CODE STATUS: Full code Diet recommendation: Regular diet   Brief/Interim Summary:  Admission HPI written by [name], MD  HPI: Jesse Davidson Jesse Davidson is a 61 y.o. male with medical history significant for gout who presented to the ED for evaluation of persistent cough and shortness of breath.  Patient states on 07/21/2021 he developed URI symptoms with cough productive of white sputum.  He says he went to urgent care on 07/22/2021.  At the time he was tested for flu and COVID which were reportedly negative.  He was prescribed a Z-Pak, cough medication, and albuterol inhaler which she has been using regularly.  He has had persistent cough and exertional dyspnea.  He has been feeling generally fatigued.  He has had associated chills and diaphoresis, poor appetite, and decreased urine output which appears dark.  He has had chest and abdominal wall discomfort related to his frequent cough.   Hospital course:  * Acute respiratory failure with hypoxia (HCC)- (present on admission) SpO2 down to 88%. Secondary to COVID infection. Oxygen therapy initiated. Patient required oxygen with ambulation and was set up with home oxygen.  Leukocytosis- (present on admission) Likely secondary to steroids. No associated fevers or worsening clinical symptoms. Stable.  COVID-19 virus infection- (present on admission) No associated pneumonia on chest x-ray. CRP elevated. Started on Remdesivir and steroids. CRP trending down. Patient completed 4 doses of Remdesivir. He was transitioned from  Solu-medrol to prednisone and will complete a 10 day total steroid burst.  Obesity (BMI 30-39.9)- (present on admission) Body mass index is 38.37 kg/m.  AKI (acute kidney injury) (HCC)-resolved as of 07/29/2021, (present on admission) Mild. Resolved with IV fluids.     Discharge Instructions   Allergies as of 07/31/2021       Reactions   Codeine Itching   Hydrocodone Itching   Prednisone Other (See Comments)        Medication List     STOP taking these medications    celecoxib 100 MG capsule Commonly known as: CeleBREX   dexamethasone 4 MG tablet Commonly known as: DECADRON   predniSONE 10 MG (21) Tbpk tablet Commonly known as: STERAPRED UNI-PAK 21 TAB Replaced by: predniSONE 50 MG tablet       TAKE these medications    allopurinol 300 MG tablet Commonly known as: ZYLOPRIM Take 1 tablet (300 mg total) by mouth 2 (two) times daily.   benzonatate 200 MG capsule Commonly known as: TESSALON Take 1 capsule (200 mg total) by mouth 3 (three) times daily as needed for cough.   colchicine 0.6 MG tablet Take 0.6 mg by mouth 2 (two) times daily.   olopatadine 0.1 % ophthalmic solution Commonly known as: PATANOL Place 1 drop into both eyes 2 (two) times daily for 5 days.   ondansetron 8 MG disintegrating tablet Commonly known as: Zofran ODT Take 1 tablet (8 mg total) by mouth every 8 (eight) hours as needed for nausea or vomiting.   pantoprazole 40 MG tablet Commonly known as: PROTONIX Take 40 mg by mouth daily.   predniSONE 50 MG tablet Commonly known as: DELTASONE Take 1 tablet (50  mg total) by mouth daily for 7 days. Start taking on: August 01, 2021 Replaces: predniSONE 10 MG (21) Tbpk tablet   promethazine-dextromethorphan 6.25-15 MG/5ML syrup Commonly known as: PROMETHAZINE-DM Take 5 mLs by mouth 4 (four) times daily as needed for cough.   zinc sulfate 220 (50 Zn) MG capsule Take 1 capsule (220 mg total) by mouth daily. Start taking on: August 01, 2021               Durable Medical Equipment  (From admission, onward)           Start     Ordered   07/31/21 1305  For home use only DME oxygen  Once       Question Answer Comment  Length of Need 6 Months   Mode or (Route) Nasal cannula   Liters per Minute 2   Frequency Continuous (stationary and portable oxygen unit needed)   Oxygen delivery system Gas      07/31/21 1304            Allergies  Allergen Reactions   Codeine Itching   Hydrocodone Itching   Prednisone Other (See Comments)    Consultations: None   Procedures/Studies: DG CHEST PORT 1 VIEW  Result Date: 07/30/2021 CLINICAL DATA:  Cough EXAM: PORTABLE CHEST 1 VIEW COMPARISON:  07/28/2021 FINDINGS: Cardiac size is within normal limits. There are no new focal pulmonary infiltrates. There is some crowding of markings in the left lower lung fields. There is no pleural effusion or pneumothorax. IMPRESSION: There are small linear densities in the left lower lung fields suggesting scarring. There are no signs of pulmonary edema or new focal infiltrates. There is no pleural effusion or pneumothorax. Electronically Signed   By: Ernie Avena M.D.   On: 07/30/2021 16:05   DG Chest Port 1 View  Result Date: 07/28/2021 CLINICAL DATA:  Cough, shortness of breath EXAM: PORTABLE CHEST 1 VIEW COMPARISON:  None. FINDINGS: Heart and mediastinal contours are within normal limits. No focal opacities or effusions. No acute bony abnormality. IMPRESSION: No active disease. Electronically Signed   By: Charlett Nose M.D.   On: 07/28/2021 19:20      Subjective: Breathing is much better. Dyspnea improved.  Discharge Exam: Vitals:   07/31/21 1154 07/31/21 1323  BP:  122/79  Pulse:  80  Resp:    Temp:  98.5 F (36.9 C)  SpO2: (!) 84% 93%   Vitals:   07/30/21 2145 07/31/21 0527 07/31/21 1154 07/31/21 1323  BP: (!) 153/95 134/83  122/79  Pulse: 78 (!) 59  80  Resp: 16 16    Temp: 99 F (37.2 C) (!) 97.4 F  (36.3 C)  98.5 F (36.9 C)  TempSrc: Oral Oral  Oral  SpO2: 92% (!) 88% (!) 84% 93%  Weight:      Height:        General: Pt is alert, awake, not in acute distress    The results of significant diagnostics from this hospitalization (including imaging, microbiology, ancillary and laboratory) are listed below for reference.     Microbiology: Recent Results (from the past 240 hour(s))  Resp Panel by RT-PCR (Flu A&B, Covid) Nasopharyngeal Swab     Status: Abnormal   Collection Time: 07/28/21  6:16 PM   Specimen: Nasopharyngeal Swab; Nasopharyngeal(NP) swabs in vial transport medium  Result Value Ref Range Status   SARS Coronavirus 2 by RT PCR POSITIVE (A) NEGATIVE Final    Comment: (NOTE) SARS-CoV-2 target nucleic acids are  DETECTED.  The SARS-CoV-2 RNA is generally detectable in upper respiratory specimens during the acute phase of infection. Positive results are indicative of the presence of the identified virus, but do not rule out bacterial infection or co-infection with other pathogens not detected by the test. Clinical correlation with patient history and other diagnostic information is necessary to determine patient infection status. The expected result is Negative.  Fact Sheet for Patients: EntrepreneurPulse.com.au  Fact Sheet for Healthcare Providers: IncredibleEmployment.be  This test is not yet approved or cleared by the [place] FDA and  has been authorized for detection and/or diagnosis of SARS-CoV-2 by FDA under an Emergency Use Authorization (EUA).  This EUA will remain in effect (meaning this test can be used) for the duration of  the COVID-19 declaration under Section 564(b)(1) of the A ct, 21 U.S.C. section 360bbb-3(b)(1), unless the authorization is terminated or revoked sooner.     Influenza A by PCR NEGATIVE NEGATIVE Final   Influenza B by PCR NEGATIVE NEGATIVE Final    Comment: (NOTE) The Xpert Xpress  SARS-CoV-2/FLU/RSV plus assay is intended as an aid in the diagnosis of influenza from Nasopharyngeal swab specimens and should not be used as a sole basis for treatment. Nasal washings and aspirates are unacceptable for Xpert Xpress SARS-CoV-2/FLU/RSV testing.  Fact Sheet for Patients: EntrepreneurPulse.com.au  Fact Sheet for Healthcare Providers: IncredibleEmployment.be  This test is not yet approved or cleared by the [place] FDA and has been authorized for detection and/or diagnosis of SARS-CoV-2 by FDA under an Emergency Use Authorization (EUA). This EUA will remain in effect (meaning this test can be used) for the duration of the COVID-19 declaration under Section 564(b)(1) of the Act, 21 U.S.C. section 360bbb-3(b)(1), unless the authorization is terminated or revoked.  Performed at [facility], [address], [place], [place] [address]      Labs: BNP (last 3 results) No results for input(s): BNP in the last 8760 hours. Basic Metabolic Panel: Recent Labs  Lab 07/28/21 1818 07/28/21 2000 07/29/21 0500 07/30/21 0425 07/31/21 0411  NA 136 134* 137 133* 133*  K 4.3 5.5* 4.3 4.7 4.7  CL 98  --  97* 102 101  CO2 25  --  27 26 24   GLUCOSE 106*  --  187* 153* 171*  BUN 20  --  25* 32* 29*  CREATININE 1.50*  --  1.21 1.15 1.15  CALCIUM 8.7*  --  8.8* 8.6* 8.8*  MG  --   --  2.5* 2.2 2.0  PHOS  --   --  4.0 3.8 4.5   Liver Function Tests: Recent Labs  Lab 07/28/21 1818 07/29/21 0500 07/30/21 0425 07/31/21 0411  AST 33 34 32 35  ALT 53* 50* 48* 53*  ALKPHOS 44 39 39 41  BILITOT 0.7 0.6 0.6 0.7  PROT 7.6 7.0 7.0 6.8  ALBUMIN 3.9 3.3* 3.3* 3.2*   No results for input(s): LIPASE, AMYLASE in the last 168 hours. No results for input(s): AMMONIA in the last 168 hours. CBC: Recent Labs  Lab 07/28/21 1818 07/28/21 2000 07/29/21 0500 07/30/21 0425 07/31/21 0411  WBC 20.0*  --  12.2* 23.3* 20.0*   NEUTROABS 15.9*  --  10.5* 20.1* 17.0*  HGB 17.9* 18.7* 16.8 16.7 16.9  HCT 54.3* 55.0* 51.4 51.2 50.6  MCV 93.1  --  95.5 95.2 95.1  PLT 257  --  196 226 258   Cardiac Enzymes: No results for input(s): CKTOTAL, CKMB, CKMBINDEX, TROPONINI in the last 168  hours. BNP: Invalid input(s): POCBNP CBG: No results for input(s): GLUCAP in the last 168 hours. D-Dimer Recent Labs    07/30/21 0425 07/31/21 0411  DDIMER <0.27 <0.27   Hgb A1c No results for input(s): HGBA1C in the last 72 hours. Lipid Profile No results for input(s): CHOL, HDL, LDLCALC, TRIG, CHOLHDL, LDLDIRECT in the last 72 hours. Thyroid function studies No results for input(s): TSH, T4TOTAL, T3FREE, THYROIDAB in the last 72 hours.  Invalid input(s): FREET3 Anemia work up Recent Labs    07/30/21 0425 07/31/21 0411  FERRITIN 613* 601*   Urinalysis    Component Value Date/Time   COLORURINE YELLOW 02/05/2021 2337   APPEARANCEUR CLEAR 02/05/2021 2337   LABSPEC <1.005 (L) 02/05/2021 2337   PHURINE 6.0 02/05/2021 2337   GLUCOSEU NEGATIVE 02/05/2021 2337   HGBUR NEGATIVE 02/05/2021 2337   [place] NEGATIVE 02/05/2021 2337   KETONESUR NEGATIVE 02/05/2021 2337   PROTEINUR NEGATIVE 02/05/2021 2337   NITRITE NEGATIVE 02/05/2021 2337   LEUKOCYTESUR NEGATIVE 02/05/2021 2337   Sepsis Labs Invalid input(s): PROCALCITONIN,  WBC,  LACTICIDVEN Microbiology Recent Results (from the past 240 hour(s))  Resp Panel by RT-PCR (Flu A&B, Covid) Nasopharyngeal Swab     Status: Abnormal   Collection Time: 07/28/21  6:16 PM   Specimen: Nasopharyngeal Swab; Nasopharyngeal(NP) swabs in vial transport medium  Result Value Ref Range Status   SARS Coronavirus 2 by RT PCR POSITIVE (A) NEGATIVE Final    Comment: (NOTE) SARS-CoV-2 target nucleic acids are DETECTED.  The SARS-CoV-2 RNA is generally detectable in upper respiratory specimens during the acute phase of infection. Positive results are indicative of the presence of the  identified virus, but do not rule out bacterial infection or co-infection with other pathogens not detected by the test. Clinical correlation with patient history and other diagnostic information is necessary to determine patient infection status. The expected result is Negative.  Fact Sheet for Patients: EntrepreneurPulse.com.au  Fact Sheet for Healthcare Providers: IncredibleEmployment.be  This test is not yet approved or cleared by the [place] FDA and  has been authorized for detection and/or diagnosis of SARS-CoV-2 by FDA under an Emergency Use Authorization (EUA).  This EUA will remain in effect (meaning this test can be used) for the duration of  the COVID-19 declaration under Section 564(b)(1) of the A ct, 21 U.S.C. section 360bbb-3(b)(1), unless the authorization is terminated or revoked sooner.     Influenza A by PCR NEGATIVE NEGATIVE Final   Influenza B by PCR NEGATIVE NEGATIVE Final    Comment: (NOTE) The Xpert Xpress SARS-CoV-2/FLU/RSV plus assay is intended as an aid in the diagnosis of influenza from Nasopharyngeal swab specimens and should not be used as a sole basis for treatment. Nasal washings and aspirates are unacceptable for Xpert Xpress SARS-CoV-2/FLU/RSV testing.  Fact Sheet for Patients: EntrepreneurPulse.com.au  Fact Sheet for Healthcare Providers: IncredibleEmployment.be  This test is not yet approved or cleared by the [place] FDA and has been authorized for detection and/or diagnosis of SARS-CoV-2 by FDA under an Emergency Use Authorization (EUA). This EUA will remain in effect (meaning this test can be used) for the duration of the COVID-19 declaration under Section 564(b)(1) of the Act, 21 U.S.C. section 360bbb-3(b)(1), unless the authorization is terminated or revoked.  Performed at [facility], [address] [place], [place] [address]       Time coordinating discharge: 35 minutes  SIGNED:   Cordelia Poche, MD [facility] 07/31/2021, 1:27 PM

## 2022-02-07 ENCOUNTER — Other Ambulatory Visit: Payer: Self-pay

## 2022-02-07 ENCOUNTER — Ambulatory Visit (HOSPITAL_COMMUNITY)
Admission: RE | Admit: 2022-02-07 | Discharge: 2022-02-07 | Disposition: A | Discharge status: Home / Self Care | Payer: Self-pay | Source: Ambulatory Visit | Attending: Orthopedic Surgery | Admitting: Orthopedic Surgery

## 2022-02-07 ENCOUNTER — Emergency Department (HOSPITAL_COMMUNITY)
Admission: EM | Admit: 2022-02-07 | Discharge: 2022-02-08 | Disposition: A | Discharge status: Home / Self Care | Payer: Self-pay | Attending: Emergency Medicine | Admitting: Emergency Medicine

## 2022-02-07 ENCOUNTER — Other Ambulatory Visit (HOSPITAL_COMMUNITY): Payer: Self-pay | Admitting: Orthopedic Surgery

## 2022-02-07 DIAGNOSIS — M7989 Other specified soft tissue disorders: Secondary | ICD-10-CM | POA: Insufficient documentation

## 2022-02-07 DIAGNOSIS — M79606 Pain in leg, unspecified: Secondary | ICD-10-CM | POA: Insufficient documentation

## 2022-02-07 DIAGNOSIS — R600 Localized edema: Secondary | ICD-10-CM | POA: Insufficient documentation

## 2022-02-07 DIAGNOSIS — M79604 Pain in right leg: Secondary | ICD-10-CM | POA: Insufficient documentation

## 2022-02-07 DIAGNOSIS — M79605 Pain in left leg: Secondary | ICD-10-CM | POA: Insufficient documentation

## 2022-02-07 LAB — CBC WITH DIFFERENTIAL/PLATELET
Abs Immature Granulocytes: 0.28 10*3/uL — ABNORMAL HIGH (ref 0.00–0.07)
Basophils Absolute: 0.1 10*3/uL (ref 0.0–0.1)
Basophils Relative: 1 %
Eosinophils Absolute: 0.2 10*3/uL (ref 0.0–0.5)
Eosinophils Relative: 1 %
HCT: 50.4 % (ref 39.0–52.0)
Hemoglobin: 16.6 g/dL (ref 13.0–17.0)
Immature Granulocytes: 2 %
Lymphocytes Relative: 15 %
Lymphs Abs: 2.4 10*3/uL (ref 0.7–4.0)
MCH: 31.1 pg (ref 26.0–34.0)
MCHC: 32.9 g/dL (ref 30.0–36.0)
MCV: 94.6 fL (ref 80.0–100.0)
Monocytes Absolute: 1.9 10*3/uL — ABNORMAL HIGH (ref 0.1–1.0)
Monocytes Relative: 12 %
Neutro Abs: 11.5 10*3/uL — ABNORMAL HIGH (ref 1.7–7.7)
Neutrophils Relative %: 69 %
Platelets: 368 10*3/uL (ref 150–400)
RBC: 5.33 MIL/uL (ref 4.22–5.81)
RDW: 12.8 % (ref 11.5–15.5)
WBC: 16.4 10*3/uL — ABNORMAL HIGH (ref 4.0–10.5)
nRBC: 0 % (ref 0.0–0.2)

## 2022-02-07 LAB — BRAIN NATRIURETIC PEPTIDE: B Natriuretic Peptide: 23.3 pg/mL (ref 0.0–100.0)

## 2022-02-07 LAB — BASIC METABOLIC PANEL
Anion gap: 9 (ref 5–15)
BUN: 16 mg/dL (ref 6–20)
CO2: 24 mmol/L (ref 22–32)
Calcium: 9.2 mg/dL (ref 8.9–10.3)
Chloride: 103 mmol/L (ref 98–111)
Creatinine, Ser: 1.42 mg/dL — ABNORMAL HIGH (ref 0.61–1.24)
GFR, Estimated: 57 mL/min — ABNORMAL LOW (ref >60)
Glucose, Bld: 106 mg/dL — ABNORMAL HIGH (ref 70–99)
Potassium: 4.4 mmol/L (ref 3.5–5.1)
Sodium: 136 mmol/L (ref 135–145)

## 2022-02-07 NOTE — Progress Notes (Signed)
Bilateral lower extremity venous duplex completed. Refer to "CV Proc" under chart review to view preliminary results.  02/07/2022 3:47 PM Eula Fried., MHA, RVT, RDCS, RDMS

## 2022-02-07 NOTE — ED Triage Notes (Signed)
Pt here for increased pain and swelling in R leg. Pt has previous fracture, has leg in knee immobilizer. Pt had outpt DVT study done that was negative. Pt reports being unable to sleep due to pain.

## 2022-02-07 NOTE — ED Provider Triage Note (Signed)
Emergency Medicine Provider Triage Evaluation Note  Jesse Davidson , a 61 y.o. male  was evaluated in triage.  Pt complains of lower extremity edema.  Patient reports fracture in left lower extremity about a week and a half ago.  He states he has had decreased activity since that time.  Reports he now has swelling in the right lower extremity as well.  DVT study today is negative.  Patient reports he has had significant pain and is unable to sleep and is concerned regarding the swelling.  Review of Systems  Positive: As above Negative: As above  Physical Exam  BP 133/79   Pulse (!) 105   Temp 99.9 F (37.7 C) (Oral)   Resp 16   SpO2 94%  Gen:   Awake, no distress   Resp:  Normal effort  MSK:   Moves extremities without difficulty  Other:    Medical Decision Making  Medically screening exam initiated at 4:44 PM.  Appropriate orders placed.  Jesse Davidson was informed that the remainder of the evaluation will be completed by another provider, this initial triage assessment does not replace that evaluation, and the importance of remaining in the ED until their evaluation is complete.     Marita Kansas, PA-C 02/07/22 1645

## 2022-02-08 MED ORDER — OXYCODONE HCL 5 MG PO TABS
5.0000 mg | ORAL_TABLET | Freq: Once | ORAL | Status: DC
  Filled 2022-02-08: qty 1

## 2022-02-08 MED ORDER — KETOROLAC TROMETHAMINE 15 MG/ML IJ SOLN
15.0000 mg | Freq: Once | INTRAMUSCULAR | Status: AC
  Administered 2022-02-08: 15 mg via INTRAMUSCULAR
  Filled 2022-02-08: qty 1

## 2022-02-08 MED ORDER — ACETAMINOPHEN 500 MG PO TABS
1000.0000 mg | ORAL_TABLET | Freq: Once | ORAL | Status: AC
  Administered 2022-02-08: 1000 mg via ORAL
  Filled 2022-02-08: qty 2

## 2022-02-08 MED ORDER — DIAZEPAM 5 MG PO TABS
5.0000 mg | ORAL_TABLET | Freq: Once | ORAL | Status: AC
  Administered 2022-02-08: 5 mg via ORAL
  Filled 2022-02-08: qty 1

## 2022-02-08 MED ORDER — MORPHINE SULFATE 15 MG PO TABS: 7.5000 mg | ORAL_TABLET | ORAL | 0 refills | Status: AC | PRN

## 2022-02-08 NOTE — ED Provider Notes (Signed)
[facility] EMERGENCY DEPARTMENT Provider Note   CSN: [id number] Arrival date & time: 02/07/22  1531     History PMHx significant for gout and obesity Chief Complaint  Patient presents with   Leg Pain   Leg Swelling    Jesse Davidson is a 61 y.o. male.  61 year old male presents with pain and swelling of bilateral lower extremities. Per pt, he fractured L fibula on July 8th.  He presented to urgent care where he received naproxen 500 mg twice daily.  Next day was seen by [facility] and given 200 mg Celebrex for pain which he started taking.  Patient states states Celebrex did not help so for the last 5 days he been taking both Celebrex and naproxen which still has not helped.  States pain and swelling started in right leg several weeks ago about 5 days after being immobile while lying in bed due to left fibular fracture. DVT was ruled out yesterday with ultrasound.  Patient states pain is so bad in both legs he cannot sleep and cannot bear weight on either leg, feels short of breath and light headed when he tries to d/t pain, and is having a difficult time getting around his home although he does have walker, wheelchair, and crutches.   Leg Pain     Home Medications Prior to Admission medications   Medication Sig Start Date End Date Taking? Authorizing Provider  morphine (MSIR) 15 MG tablet Take 0.5 tablets (7.5 mg total) by mouth every 4 (four) hours as needed for up to 6 doses for severe pain. 02/08/22  Yes Erick Alley, DO  allopurinol (ZYLOPRIM) 300 MG tablet Take 1 tablet (300 mg total) by mouth 2 (two) times daily. 11/13/20   Monica Becton, MD  benzonatate (TESSALON) 200 MG capsule Take 1 capsule (200 mg total) by mouth 3 (three) times daily as needed for cough. 07/31/21   Narda Bonds, MD  colchicine 0.6 MG tablet Take 0.6 mg by mouth 2 (two) times daily.    [provider]  ondansetron (ZOFRAN ODT) 8 MG disintegrating tablet Take 1  tablet (8 mg total) by mouth every 8 (eight) hours as needed for nausea or vomiting. Patient not taking: Reported on 07/29/2021 02/05/21   Cathren Laine, MD  pantoprazole (PROTONIX) 40 MG tablet Take 40 mg by mouth daily.    [provider]  promethazine-dextromethorphan (PROMETHAZINE-DM) 6.25-15 MG/5ML syrup Take 5 mLs by mouth 4 (four) times daily as needed for cough.    [provider]      Allergies    Codeine, Hydrocodone, and Prednisone    Review of Systems   Review of Systems  Constitutional:  Positive for appetite change.  Respiratory:  Positive for shortness of breath.   Cardiovascular:  Positive for leg swelling. Negative for chest pain.  Gastrointestinal:  Negative for abdominal pain and diarrhea.  Neurological:  Positive for weakness and light-headedness. Negative for headaches.    Physical Exam Updated Vital Signs BP (!) 145/94   Pulse 83   Temp 97.6 F (36.4 C) (Oral)   Resp 18   SpO2 94%  Physical Exam Constitutional:      General: He is not in acute distress.    Appearance: He is obese. He is not ill-appearing, toxic-appearing or diaphoretic.  Cardiovascular:     Rate and Rhythm: Normal rate and regular rhythm.  Pulmonary:     Effort: Pulmonary effort is normal.     Breath sounds: Normal breath  sounds.  Abdominal:     General: Bowel sounds are normal. There is no distension.     Palpations: Abdomen is soft.     Tenderness: There is no abdominal tenderness.  Musculoskeletal:     Right foot: Normal range of motion. Swelling and tenderness present. Normal pulse.     Left foot: Swelling and tenderness present.     Comments: Sensation intact of both lower extremities, able to wiggle toes.   Skin:    General: Skin is warm and dry.  Neurological:     General: No focal deficit present.     Mental Status: He is alert.  Psychiatric:        Mood and Affect: Mood normal.        Behavior: Behavior normal.     ED Results / Procedures / Treatments    Labs (all labs ordered are listed, but only abnormal results are displayed) Labs Reviewed  CBC WITH DIFFERENTIAL/PLATELET - Abnormal; Notable for the following components:      Result Value   WBC 16.4 (*)    Neutro Abs 11.5 (*)    Monocytes Absolute 1.9 (*)    Abs Immature Granulocytes 0.28 (*)    All other components within normal limits  BASIC METABOLIC PANEL - Abnormal; Notable for the following components:   Glucose, Bld 106 (*)    Creatinine, Ser 1.42 (*)    GFR, Estimated 57 (*)    All other components within normal limits  BRAIN NATRIURETIC PEPTIDE    EKG None  Radiology VAS [place] LOWER EXTREMITY VENOUS (DVT)  Result Date: 02/07/2022  Lower Venous DVT Study Patient Name:  Jesse Davidson  Date of Exam:   02/07/2022 Medical Rec #: [id number]         Accession #:    [id number] Date of Birth: April 18, 1961          Patient Gender: M Patient Age:   71 years Exam Location:  [facility] Procedure:      VAS [place] LOWER EXTREMITY VENOUS (DVT) Referring Phys: Ernestina Columbia --------------------------------------------------------------------------------  Indications: Extreme bilateral lower extremity pain and swelling. S/p left proximal fibula fracture.  Comparison Study: No prior study Performing Technologist: Gertie Fey MHA, RDMS, RVT, RDCS  Examination Guidelines: A complete evaluation includes B-mode imaging, spectral Doppler, color Doppler, and power Doppler as needed of all accessible portions of each vessel. Bilateral testing is considered an integral part of a complete examination. Limited examinations for reoccurring indications may be performed as noted. The reflux portion of the exam is performed with the patient in reverse Trendelenburg.  +---------+---------------+---------+-----------+----------+--------------+ RIGHT    CompressibilityPhasicitySpontaneityPropertiesThrombus Aging +---------+---------------+---------+-----------+----------+--------------+ CFV       Full           Yes      Yes                                 +---------+---------------+---------+-----------+----------+--------------+ SFJ      Full                                                        +---------+---------------+---------+-----------+----------+--------------+ FV Prox  Full                                                        +---------+---------------+---------+-----------+----------+--------------+  FV Mid   Full                                                        +---------+---------------+---------+-----------+----------+--------------+ FV DistalFull                                                        +---------+---------------+---------+-----------+----------+--------------+ PFV      Full                                                        +---------+---------------+---------+-----------+----------+--------------+ POP      Full           Yes      Yes                                 +---------+---------------+---------+-----------+----------+--------------+ PTV      Full                                                        +---------+---------------+---------+-----------+----------+--------------+ PERO     Full                                                        +---------+---------------+---------+-----------+----------+--------------+   +---------+---------------+---------+-----------+---------------+--------------+ LEFT     CompressibilityPhasicitySpontaneityProperties     Thrombus Aging +---------+---------------+---------+-----------+---------------+--------------+ CFV      Full           Yes      Yes                                      +---------+---------------+---------+-----------+---------------+--------------+ SFJ      Full                                                             +---------+---------------+---------+-----------+---------------+--------------+ FV Prox  Full                                                              +---------+---------------+---------+-----------+---------------+--------------+ FV Mid   Full                                                             +---------+---------------+---------+-----------+---------------+--------------+  FV Distal                                   patent by color                                                           Doppler                       +---------+---------------+---------+-----------+---------------+--------------+ PFV      Full                                                             +---------+---------------+---------+-----------+---------------+--------------+ POP                              Yes        patent by color                                                           and PW Doppler                +---------+---------------+---------+-----------+---------------+--------------+ PTV      Full                                                             +---------+---------------+---------+-----------+---------------+--------------+ PERO     Full                                                             +---------+---------------+---------+-----------+---------------+--------------+     Summary: RIGHT: - There is no evidence of deep vein thrombosis in the lower extremity. However, portions of this examination were limited- see technologist comments above.  - No cystic structure found in the popliteal fossa.  LEFT: - There is no evidence of deep vein thrombosis in the lower extremity. However, portions of this examination were limited- see technologist comments above.  - No cystic structure found in the popliteal fossa.  *See table(s) above for measurements and observations. Electronically signed by Heath Lark on 02/07/2022 at 8:24:41 PM.    Final     Procedures None  Medications Ordered in ED Medications  oxyCODONE (Oxy IR/ROXICODONE)  immediate release tablet 5 mg (5 mg Oral Not Given 02/08/22 1415)  diazepam (VALIUM) tablet 5 mg (5 mg Oral Given 02/08/22 1414)  ketorolac (TORADOL) 15 MG/ML injection 15 mg (15 mg Intramuscular Given 02/08/22 1414)  acetaminophen (TYLENOL) tablet 1,000  mg (1,000 mg Oral Given 02/08/22 1414)    ED Course/ Medical Decision Making/ A&P                           Medical Decision Making 61 year old male presents with pain and swelling of bilateral lower extremities. VSS. Pt has leukocytosis likely due to fracture. Cr mildly elevated at 1.42 possibly due to decreased p.o. intake as pt has been lying in bed for a few weeks. Pain and swelling of left lower extremity as expected due to left fibular fracture per patient.  Swelling and pain of right lower extremity most likely due to putting more pressure on this extremity as he has not been able to use left lower extremity.  Can also consider venous stasis as cause of swelling and patient would benefit with follow-up with PCP.  Patient already had x-ray completed of the right lower extremity which did not show any bony abnormalities per patient.  He has no indication for admission at this time and is already being followed by outpatient orthopedics for the fracture of his left femur.  For pain, he was administered Toradol 15 mg IM, oxycodone 5 mg p.o., Valium 5 mg p.o., Tylenol 1 g p.o. and he was discharged with morphine 7.5 mg every 4 hours prn for 6 doses and instructions to follow-up with PCP and advised to push through the pain and utilize his walker, crutches, and wheelchair so that he is not immobile at home.    Final Clinical Impression(s) / ED Diagnoses Final diagnoses:  Leg swelling  Right leg pain  Left leg pain    Rx / DC Orders ED Discharge Orders          Ordered    morphine (MSIR) 15 MG tablet  Every 4 hours PRN        02/08/22 1318              Erick Alley, DO 02/08/22 1439    Melene Plan, DO 02/08/22 1458

## 2022-02-08 NOTE — ED Notes (Signed)
Patient verbalizes understanding of discharge instructions. Opportunity for questioning and answers were provided. Pt discharged from ED. 

## 2022-02-08 NOTE — Discharge Instructions (Addendum)
Thank you for allowing [place] to care for you today. We treated you with several pain medications and prescribed you 6 doses of morphine 7.5 mg (1/2 tablet). I advise you to also take over the counter tylenol, up to 1000 mg every 6 hours along with the Celebrex previously prescribed to you. STOP taking the naproxen. It should not be used while you are also taking the Celebrex. Please follow up with a primary care provider.

## 2022-02-08 NOTE — TOC CM/SW Note (Addendum)
Transition of Care [facility]) - Emergency Department Mini Assessment   Patient Details  Name: Jesse Davidson MRN: [id number] Date of Birth: 15-Nov-1960  [facility] of [facility]) CM/SW Contact:    Lavenia Atlas, RN Phone Number: 02/08/2022, 1:21 PM   Clinical Narrative: Patient presents to [facility] ED for increased pain and swelling of right leg. RNCM received TOC consult for PCP needs and possible transportation.    ED Mini Assessment:  RNCM spoke with patient via phone regarding PCP needs. RNCM will call to schedule appointment for patient, as patient does not have insurance or PCP. Patient reports one of his workers is in the room with him and can take him home however he can't take the patient in his home. Patient reports he has crutches, wheelchair and walker at home. This RNCM suggest he contact the fire department to get assistance getting out of his car. Patient questioned SNF placement, this RNCM advised patient due to him not having insurance he will need to pay the SNF directly. This RNCM does not know the exact cost for SNF, advised can run $7k-$10k+ monthly. Patient questioning being admitted this RNCM advised that he has been medically cleared for discharge, EDP does not see a reason for inpatient admission.     Addendum: The earliest appointment with [facility] is scheduled on 03/06/2022 at 3:50 pm. PCP will call if they are able to see patient sooner.  This RNCM requested an earlier appointment if at all possible, and provided patient cell# [phone]  Patient Contact and Communications Patient : [phone]      Admission diagnosis:  hand pain Patient Active Problem List   Diagnosis Date Noted   Acute respiratory failure with hypoxia (HCC) 07/28/2021   COVID-19 virus infection 07/28/2021   Leukocytosis 07/28/2021   Polyarthralgia 11/10/2020   Gout 11/10/2020   Right shoulder pain 11/10/2020   Primary osteoarthritis of both knees 11/10/2020   Obesity  (BMI 30-39.9) 11/10/2020   PCP:  Patient, No Pcp Per Pharmacy:   [facility] [id number] - [address], [place] - [address] [facility] [address] [place] [address] Phone: [phone] Fax: [phone]  [id number] [place], [place] - [address] AT [facility] [address] [facility] [address] [place] [address] Phone: [phone] Fax: [phone]

## 2022-03-06 ENCOUNTER — Inpatient Hospital Stay: Payer: Self-pay | Admitting: Physician Assistant

## 2022-03-06 NOTE — Progress Notes (Deleted)
Patient ID: Jesse Davidson, male   DOB: [birth year], 61 y.o.   MRN: [id number]   After ED visit 61 year old male presents with pain and swelling of bilateral lower extremities. Per pt, he fractured L fibula on July 8th.  He presented to urgent care where he received naproxen 500 mg twice daily.  Next day was seen by [facility] and given 200 mg Celebrex for pain which he started taking.  Patient states states Celebrex did not help so for the last 5 days he been taking both Celebrex and naproxen which still has not helped.  States pain and swelling started in right leg several weeks ago about 5 days after being immobile while lying in bed due to left fibular fracture. DVT was ruled out yesterday with ultrasound.  Patient states pain is so bad in both legs he cannot sleep and cannot bear weight on either leg, feels short of breath and light headed when he tries to d/t pain, and is having a difficult time getting around his home although he does have walker, wheelchair, and crutches.   Medical Decision Making 61 year old male presents with pain and swelling of bilateral lower extremities. VSS. Pt has leukocytosis likely due to fracture. Cr mildly elevated at 1.42 possibly due to decreased p.o. intake as pt has been lying in bed for a few weeks. Pain and swelling of left lower extremity as expected due to left fibular fracture per patient.  Swelling and pain of right lower extremity most likely due to putting more pressure on this extremity as he has not been able to use left lower extremity.  Can also consider venous stasis as cause of swelling and patient would benefit with follow-up with PCP.  Patient already had x-ray completed of the right lower extremity which did not show any bony abnormalities per patient.  He has no indication for admission at this time and is already being followed by outpatient orthopedics for the fracture of his left femur.  For pain, he was administered Toradol 15 mg IM, oxycodone  5 mg p.o., Valium 5 mg p.o., Tylenol 1 g p.o. and he was discharged with morphine 7.5 mg every 4 hours prn for 6 doses and instructions to follow-up with PCP and advised to push through the pain and utilize his walker, crutches, and wheelchair so that he is not immobile at home.
